# Patient Record
Sex: Male | Born: 1945 | Race: White | Hispanic: No | Marital: Married | Smoking: Former smoker
Health system: Southern US, Community
[De-identification: ages and names within clinical notes are randomized; demographics above are authoritative.]

## PROBLEM LIST (undated history)

## (undated) DIAGNOSIS — K5190 Ulcerative colitis, unspecified, without complications: Secondary | ICD-10-CM

## (undated) DIAGNOSIS — G510 Bell's palsy: Secondary | ICD-10-CM

## (undated) DIAGNOSIS — Z8669 Personal history of other diseases of the nervous system and sense organs: Secondary | ICD-10-CM

## (undated) DIAGNOSIS — R609 Edema, unspecified: Secondary | ICD-10-CM

## (undated) DIAGNOSIS — I219 Acute myocardial infarction, unspecified: Secondary | ICD-10-CM

## (undated) DIAGNOSIS — G4733 Obstructive sleep apnea (adult) (pediatric): Secondary | ICD-10-CM

## (undated) DIAGNOSIS — I2510 Atherosclerotic heart disease of native coronary artery without angina pectoris: Secondary | ICD-10-CM

## (undated) DIAGNOSIS — E785 Hyperlipidemia, unspecified: Secondary | ICD-10-CM

## (undated) DIAGNOSIS — E669 Obesity, unspecified: Secondary | ICD-10-CM

## (undated) DIAGNOSIS — I10 Essential (primary) hypertension: Secondary | ICD-10-CM

## (undated) HISTORY — DX: Atherosclerotic heart disease of native coronary artery without angina pectoris: I25.10

## (undated) HISTORY — DX: Ulcerative colitis, unspecified, without complications: K51.90

## (undated) HISTORY — DX: Obesity, unspecified: E66.9

## (undated) HISTORY — DX: Acute myocardial infarction, unspecified: I21.9

## (undated) HISTORY — DX: Edema, unspecified: R60.9

## (undated) HISTORY — DX: Hyperlipidemia, unspecified: E78.5

## (undated) HISTORY — DX: Essential (primary) hypertension: I10

## (undated) HISTORY — DX: Bell's palsy: G51.0

## (undated) HISTORY — DX: Obstructive sleep apnea (adult) (pediatric): G47.33

---

## 1994-10-28 DIAGNOSIS — I219 Acute myocardial infarction, unspecified: Secondary | ICD-10-CM

## 1994-10-28 HISTORY — DX: Acute myocardial infarction, unspecified: I21.9

## 2005-11-27 ENCOUNTER — Ambulatory Visit: Payer: Self-pay | Admitting: Cardiology

## 2005-12-19 ENCOUNTER — Encounter: Payer: Self-pay | Admitting: Neurology

## 2009-07-05 ENCOUNTER — Ambulatory Visit: Payer: Self-pay | Admitting: Cardiology

## 2010-04-27 HISTORY — PX: OTHER SURGICAL HISTORY: SHX169

## 2014-02-03 ENCOUNTER — Encounter: Payer: Self-pay | Admitting: Neurology

## 2014-02-08 ENCOUNTER — Ambulatory Visit (INDEPENDENT_AMBULATORY_CARE_PROVIDER_SITE_OTHER): Payer: Medicare Other | Admitting: Neurology

## 2014-02-08 ENCOUNTER — Encounter: Payer: Self-pay | Admitting: Neurology

## 2014-02-08 ENCOUNTER — Encounter (INDEPENDENT_AMBULATORY_CARE_PROVIDER_SITE_OTHER): Payer: Self-pay

## 2014-02-08 VITALS — BP 139/76 | HR 57 | Resp 16 | Ht 70.0 in | Wt 221.0 lb

## 2014-02-08 DIAGNOSIS — G4733 Obstructive sleep apnea (adult) (pediatric): Secondary | ICD-10-CM

## 2014-02-08 NOTE — Patient Instructions (Signed)

## 2014-02-08 NOTE — Progress Notes (Signed)
[facility]  Provider:  Melvyn Novas, M D  Referring Provider: No ref. provider found Primary Care Physician:  Maebelle Munroe Hix, MD  at [facility], Family Practice     HPI:  Alejandro Sherman is a 68 y.o. caucasian, right handed, married  male , who is seen here as a revisit ( from Dr. Bo Merino) for CPAP compliance.   [name] underwent a sleep study on 12-20-05, at the time with and excessive daytime sleepiness complaint, reflected and an Epworth score of 16 points. At the time of the study his BMI was 31.7 his neck circumference 18 inches and he had a history of hypertension, elevated cholesterol and nocturia. The study showed an AHI of 15.9 REM sleep was not noted prior to CPAP initiation the patient was then titrated to 8 cm water with an AHI of 0.0. The last time I had seen [name] was in March  2012, 3 years ago. He states he has continued to do well with his CPAP machine and is very compliant.  We were able to download his machine he and his average time of day he use of CPAP at 7 hours and 40 minutes, his compliance is 100% for 90 days and is residual AHI is 3.0. The machine is set between 4 and 20 Cm water  ,  his 95th percentile use is  10.5 cm water . Since the patient has a moderate air leak and seems to benefit from this variable setting,  I would not suggest to reduce the pressure window .  He used to be followed by [facility]. He is now using an internet based supplier. He uses a nasal pillow.   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 He is no longer bound to an early rise time in the morning and has developed some changes in his sleep pattern. He likes to read and that normally would initiate sleep before midnight. He usually sleeps at night through and has nocturia breaks. He falls asleep rather promptly. He still wakes up spontaneously and does not require an alarm. He frequently has a dryish mouth  in the morning . He does not report any morning headaches.  He is usually refreshed and restored as he rises. He takes a regular breakfast at home, he does not use any caffeine at all.  Very rarely would he be napping daytime. His average time of sleep per night is approximately 8 hours.  This is reflected in his time of CPAP use.  He exercises " religiously" , twice a week [place] [employer]. He walks daily a 2 mile distance. He uses a treat mill and elliptical trainer at [employer]. He doesn't regularly swim.     Review of Systems: Out of a complete 14 system review, the patient complains of only the following symptoms, and all other reviewed systems are negative. The patient endorsed date the fatigue score at 24 points in the Epworth sleepiness score at 12 points , GDS  at 0 points.  History   Social History  . Marital Status: Married    Spouse Name: Alejandro Sherman    Number of Children: 2  . Years of Education: 16   Occupational History  . Not on file.   Social History Main Topics  . Smoking status: Former Games developer  . Smokeless tobacco: Never Used  . Alcohol Use: Yes  Comment: 2 glasses of wine- nightly  . Drug Use: No  . Sexual Activity: Not on file   Other Topics Concern  . Not on file   Social History Narrative   Patient is married. Alejandro Sherman(Pamela)   Patient has two adult children.   Patient is retired.   Patient has a college education.   Patient is right-handed.   Patient does not drink any caffeine.          Family History  Problem Relation Age of Onset  . Kidney cancer Mother   . Heart attack Father   . Ovarian cancer Sister     Past Medical History  Diagnosis Date  . Ulcerative colitis   . Edema   . Bell's palsy     20 years ago, affecting the left facial nerve  . CAD (coronary artery disease)   . Myocardial infarction 1996  . Obesity   . Hyperlipidemia   . OSA (obstructive sleep apnea)     on CPAP,  12-19-05  . Essential hypertension,  benign     Past Surgical History  Procedure Laterality Date  . Ulcerative colitis  04/2010    Hospitalization    Current Outpatient Prescriptions  Medication Sig Dispense Refill  . aspirin 81 MG tablet Take 81 mg by mouth daily.      Marland Kitchen. atorvastatin (LIPITOR) 40 MG tablet Take 40 mg by mouth daily.      . Calcium Carbonate-Vitamin D 600-400 MG-UNIT per tablet Take 1 tablet by mouth 2 (two) times daily.      Marland Kitchen. ezetimibe (ZETIA) 10 MG tablet Take 10 mg by mouth daily.      . fluticasone (FLONASE) 50 MCG/ACT nasal spray Place 2 sprays into both nostrils daily.      . mesalamine (LIALDA) 1.2 G EC tablet Take 1.2 g by mouth daily with breakfast.      . metoprolol succinate (TOPROL-XL) 50 MG 24 hr tablet Take 50 mg by mouth daily. Take with or immediately following a meal.      . ranitidine (ZANTAC) 150 MG capsule Take 150 mg by mouth 2 (two) times daily.       No current facility-administered medications for this visit.    Allergies as of 02/08/2014  . (Not on File)    Vitals: BP 139/76  Pulse 57  Resp 16  Ht 5\' 10"  (1.778 m)  Wt 221 lb (100.245 kg)  BMI 31.71 kg/m2 Last Weight:  Wt Readings from Last 1 Encounters:  02/08/14 221 lb (100.245 kg)   Last Height:   Ht Readings from Last 1 Encounters:  02/08/14 5\' 10"  (1.778 m)     Physical exam:  General: The patient is awake, alert and appears not in acute distress. The patient is well groomed. Head: Normocephalic, atraumatic. Neck is supple. Mallampati 3 , neck circumference: 19 inches . Cardiovascular:  Regular rate and rhythm , without  murmurs or carotid bruit, and without distended neck veins. Respiratory: Lungs are clear to auscultation. Skin:  Without evidence of edema, or rash Trunk: BMI is elevated and patient  has normal posture.  Neurologic exam : The patient is awake and alert, oriented to place and time.  Memory subjective described as intact.  There is a normal attention span & concentration ability. Speech  is fluent without  dysarthria, dysphonia or aphasia. Mood and affect are appropriate.  Cranial nerves: Pupils are equal and briskly reactive to light. Funduscopic exam without  evidence of pallor or edema.  Extraocular  movements  in vertical and horizontal planes intact and without nystagmus. Visual fields by finger perimetry are intact. Hearing to finger rub intact.  Facial sensation intact to fine touch. Facial motor strength is symmetric and tongue and uvula move midline.  Motor exam: Normal tone and , muscle bulk and symmetric normal strength in all extremities.  Sensory:  Fine touch, pinprick and vibration were tested in all extremities. Proprioception is  normal.  Coordination: Rapid alternating movements in the fingers/hands is normal without evidence of ataxia, dysmetria or tremor.  Gait and station: Patient walks without assistive device. Strength within normal limits. Stance is stable and normal.   Deep tendon reflexes: in the  upper and lower extremities are symmetric and intact.    Assessment:  After physical and neurologic examination, review of laboratory studies, imaging, neurophysiology testing and pre-existing records, assessment is   1) OSA , well controlled on CPAP , auto set 4-20 cm water. We could reduce the window to 4-14 cm as his 95% is near 10.  He requires a prescription of new mask, tube,   Plan:  Treatment plan and additional workup : provide replacement parts.  Patient well aware of BMI related risk for OSA, feels comfortable wit his pressure.

## 2014-08-12 ENCOUNTER — Other Ambulatory Visit: Payer: Self-pay

## 2014-09-13 ENCOUNTER — Encounter: Payer: Self-pay | Admitting: Neurology

## 2015-01-05 ENCOUNTER — Ambulatory Visit (INDEPENDENT_AMBULATORY_CARE_PROVIDER_SITE_OTHER): Payer: Medicare Other | Admitting: Adult Health

## 2015-01-05 ENCOUNTER — Encounter: Payer: Self-pay | Admitting: Adult Health

## 2015-01-05 VITALS — BP 153/81 | HR 65 | Ht 70.0 in | Wt 218.0 lb

## 2015-01-05 DIAGNOSIS — Z9989 Dependence on other enabling machines and devices: Principal | ICD-10-CM

## 2015-01-05 DIAGNOSIS — G4733 Obstructive sleep apnea (adult) (pediatric): Secondary | ICD-10-CM

## 2015-01-05 NOTE — Patient Instructions (Signed)
Continue using CPAP daily. I will call you if results show any concerns.

## 2015-01-05 NOTE — Progress Notes (Signed)
PATIENT: Alejandro Sherman DOB: 05-03-1946  REASON FOR VISIT: follow up- OSA on CPAP HISTORY FROM: patient  HISTORY OF PRESENT ILLNESS: Alejandro Sherman is a 69 year old male with history of obstructive sleep Apnea. He returns today for a 90 day compliance download. The patient did not bring his card with him today. We called his DME company to get a download. However there has been a malfunction with his card. The DME company is asked that he bring her card by their office for review. The patient states that he has been using his CPAP nightly. His Epworth score is 9 points was previously 12 points. His fatigue severity score is 18 was previously 24.  Patient reports that he gets about 8 hours of sleep a night. He goes to bed around 11:30 PM and arises at 7:30 AM. He denies having trouble falling asleep or staying a sleep. States that he rarely has to get up at  night to urinate. Patient states that he has been exercising 3 times a week. On his off days him and his wife will go for a walk. The patient also likes to dance. He and his wife frequently go to shagging events. Overall patient feels that CPAP has improved his sleepiness and fatigue. Since the last visit the patient has had no new medical issues.   HISTORY 02/08/14 [facility]): Alejandro Sherman is a 69 y.o. caucasian, right handed, married male , who is seen here as a revisit ( from Dr. Bo Merino) for CPAP compliance.   Alejandro Sherman underwent a sleep study on 12-20-05, at the time with and excessive daytime sleepiness complaint, reflected and an Epworth score of 16 points. At the time of the study his BMI was 31.7 his neck circumference 18 inches and he had a history of hypertension, elevated cholesterol and nocturia. The study showed an AHI of 15.9 REM sleep was not noted prior to CPAP initiation the patient was then titrated to 8 cm water with an AHI of 0.0. The last time I had seen Alejandro Sherman was in March 2012, 3 years ago. He states he has continued to  do well with his CPAP machine and is very compliant.  We were able to download his machine he and his average time of day he use of CPAP at 7 hours and 40 minutes, his compliance is 100% for 90 days and is residual AHI is 3.0. The machine is set between 4 and 20 Cm water , his 95th percentile use is 10.5 cm water . Since the patient has a moderate air leak and seems to benefit from this variable setting, I would not suggest to reduce the pressure window . He used to be followed by [facility]. He is now using an internet based supplier. He uses a nasal pillow.   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 He is no longer bound to an early rise time in the morning and has developed some changes in his sleep pattern. He likes to read and that normally would initiate sleep before midnight. He usually sleeps at night through and has nocturia breaks. He falls asleep rather promptly. He still wakes up spontaneously and does not require an alarm. He frequently has a dryish mouth in the morning . He does not report any morning headaches.  He is usually refreshed and restored as he rises. He takes a regular breakfast at home, he does  not use any caffeine at all.  Very rarely would he be napping daytime. His average time of sleep per night is approximately 8 hours.  This is reflected in his time of CPAP use.  He exercises " religiously" , twice a week to silver sneakers [place] [employer]. He walks daily a 2 mile distance. He uses a treat mill and elliptical trainer at [employer]. He doesn't regularly swim.  REVIEW OF SYSTEMS: Out of a complete 14 system review of symptoms, the patient complains only of the following symptoms, and all other reviewed systems are negative. See history of present illness  ALLERGIES: Not on File  HOME MEDICATIONS: Outpatient Prescriptions Prior to Visit  Medication Sig Dispense Refill  . aspirin 81 MG  tablet Take 81 mg by mouth daily.    Marland Kitchen. atorvastatin (LIPITOR) 40 MG tablet Take 40 mg by mouth daily.    . Calcium Carbonate-Vitamin D 600-400 MG-UNIT per tablet Take 1 tablet by mouth 2 (two) times daily.    Marland Kitchen. ezetimibe (ZETIA) 10 MG tablet Take 10 mg by mouth daily.    . fluticasone (FLONASE) 50 MCG/ACT nasal spray Place 2 sprays into both nostrils daily.    . mesalamine (LIALDA) 1.2 G EC tablet Take 1.2 g by mouth daily with breakfast.    . metoprolol succinate (TOPROL-XL) 50 MG 24 hr tablet Take 50 mg by mouth daily. Take with or immediately following a meal.    . ranitidine (ZANTAC) 150 MG capsule Take 150 mg by mouth 2 (two) times daily.     No facility-administered medications prior to visit.    PAST MEDICAL HISTORY: Past Medical History  Diagnosis Date  . Ulcerative colitis   . Edema   . Bell's palsy     20 years ago, affecting the left facial nerve  . CAD (coronary artery disease)   . Myocardial infarction 1996  . Obesity   . Hyperlipidemia   . OSA (obstructive sleep apnea)     on CPAP,  12-19-05  . Essential hypertension, benign     PAST SURGICAL HISTORY: Past Surgical History  Procedure Laterality Date  . Ulcerative colitis  04/2010    Hospitalization    FAMILY HISTORY: Family History  Problem Relation Age of Onset  . Kidney cancer Mother   . Heart attack Father   . Ovarian cancer Sister     SOCIAL HISTORY: History   Social History  . Marital Status: Married    Spouse Name: Rinaldo Cloudamela  . Number of Children: 2  . Years of Education: 16   Occupational History  . Not on file.   Social History Main Topics  . Smoking status: Former Games developermoker  . Smokeless tobacco: Never Used  . Alcohol Use: Yes     Comment: 2 glasses of wine- nightly  . Drug Use: No  . Sexual Activity: Not on file   Other Topics Concern  . Not on file   Social History Narrative   Patient is married. [name](Pamela)   Patient has two adult children.   Patient is retired.   Patient has a  college education.   Patient is right-handed.   Patient does not drink any caffeine.            PHYSICAL EXAM  Filed Vitals:   01/05/15 1057  BP: 153/81  Pulse: 65  Height: 5\' 10"  (1.778 m)  Weight: 218 lb (98.884 kg)   Body mass index is 31.28 kg/(m^2).  Generalized: Well developed, in no acute distress  Neck: Circumference 18 inches Mallampati 4+  Neurological examination  Mentation: Alert oriented to time, place, history taking. Follows all commands speech and language fluent Cranial nerve II-XII: Pupils were equal round reactive to light. Extraocular movements were full, visual field were full on confrontational test. Facial sensation and strength were normal. Uvula tongue midline. Head turning and shoulder shrug  were normal and symmetric. Motor: The motor testing reveals 5 over 5 strength of all 4 extremities. Good symmetric motor tone is noted throughout.  Sensory: Sensory testing is intact to soft touch on all 4 extremities. No evidence of extinction is noted.  Coordination: Cerebellar testing reveals good finger-nose-finger and heel-to-shin bilaterally.  Gait and station: Gait is normal.  Reflexes: Deep tendon reflexes are symmetric and normal bilaterally.     DIAGNOSTIC DATA (LABS, IMAGING, TESTING) - I reviewed patient records, labs, notes, testing and imaging myself where available.     ASSESSMENT AND PLAN 69 y.o. year old male  has a past medical history of Ulcerative colitis; Edema; Bell's palsy; CAD (coronary artery disease); Myocardial infarction (1996); Obesity; Hyperlipidemia; OSA (obstructive sleep apnea); and Essential hypertension, benign. here with:  1. OSA on CPAP  The patient did not bring his card with him today. According to his DME company there has been a malfunction with the card. They were unable to send [place] a download. The patient was instructed to take his card to his DME company for review. Once his card is working properly the DME company  can send [place] a compliance report or the patient can bring his card to the sleep lab for a download. Patient verbalized understanding. The patient was also provided with a prescription for renewal of his CPAP supplies. He will follow-up in one year or sooner if needed.    Butch Penny, MSN, NP-C 01/05/2015, 10:56 AM [facility] [address] [place], [place] [address] [phone]  Note: This document was prepared with digital dictation and possible smart phrase technology. Any transcriptional errors that result from this process are unintentional.

## 2015-01-05 NOTE — Progress Notes (Signed)
I agree with the assessment and plan as directed by NP .The patient is known to me .   Gregori Abril, MD  

## 2015-01-31 ENCOUNTER — Telehealth: Payer: Self-pay | Admitting: Neurology

## 2015-01-31 DIAGNOSIS — G4733 Obstructive sleep apnea (adult) (pediatric): Secondary | ICD-10-CM

## 2015-01-31 DIAGNOSIS — Z9989 Dependence on other enabling machines and devices: Principal | ICD-10-CM

## 2015-01-31 NOTE — Telephone Encounter (Signed)
Patient contacted the office requesting a prescription for a new CPAP machine and his sleep study be faxed to the [facility] at [phone]. Patient can be reached at [phone].

## 2015-02-01 NOTE — Telephone Encounter (Signed)
cpap order placed. pls fax sleep studies to [facility]

## 2015-02-10 ENCOUNTER — Ambulatory Visit: Payer: Medicare Other | Admitting: Neurology

## 2015-02-10 ENCOUNTER — Ambulatory Visit: Payer: Medicare Other | Admitting: Nurse Practitioner

## 2015-02-20 ENCOUNTER — Encounter: Payer: Self-pay | Admitting: Neurology

## 2015-04-24 ENCOUNTER — Other Ambulatory Visit: Payer: Self-pay

## 2016-01-02 ENCOUNTER — Encounter: Payer: Self-pay | Admitting: Adult Health

## 2016-01-02 ENCOUNTER — Ambulatory Visit (INDEPENDENT_AMBULATORY_CARE_PROVIDER_SITE_OTHER): Payer: Medicare Other | Admitting: Adult Health

## 2016-01-02 VITALS — BP 157/81 | HR 59 | Ht 70.0 in | Wt 217.0 lb

## 2016-01-02 DIAGNOSIS — G4733 Obstructive sleep apnea (adult) (pediatric): Secondary | ICD-10-CM

## 2016-01-02 DIAGNOSIS — R413 Other amnesia: Secondary | ICD-10-CM

## 2016-01-02 DIAGNOSIS — Z9989 Dependence on other enabling machines and devices: Principal | ICD-10-CM

## 2016-01-02 NOTE — Progress Notes (Signed)
Patient brought his memory card back to the office for a download. His download indicates that he uses machine for compliance of 100%. He uses machine greater than 4 hours each night. On average he uses his machine 7 hours and 21 minutes. His residual AHI is 0.9 on a minimum pressure of 5 cm of water and maximum pressure of 20 cm of water. The patient has a leak in the 95th percentile at 20.8 L/m.

## 2016-01-02 NOTE — Progress Notes (Signed)
PATIENT: Alejandro Sherman DOB: September 17, 1946  REASON FOR VISIT: follow up- obstructive sleep apnea on CPAP HISTORY FROM: patient  HISTORY OF PRESENT ILLNESS: Mr. Alejandro Sherman is a 70 year old male with a history of obstructive sleep apnea. He returns today for a compliance download. The patient recently got a new CPAP machine through the [facility]. He was unsure if he should bring the machine as he was under the assumption that we can do a wireless download. He states that he will bring his memory card by here this afternoon. The patient's wife is with him today. She has some concerns about his cognition. She states that he has a hard time with his short-term memory. She states that he will forget conversations that took place an hour ago. The patient has also noticed that he has a hard time recalling certain things. They do not feel that the change has been overly significant just something they have taken notice of. He is able to complete all ADLs independently. He operates a Librarian, academic without difficulty. Denies any depression or anxiety. He returns today for an evaluation.  HISTORY 01/05/15:Mr. Alejandro Sherman is a 70 year old male with history of obstructive sleep Apnea. He returns today for a 90 day compliance download. The patient did not bring his card with him today. We called his DME company to get a download. However there has been a malfunction with his card. The DME company is asked that he bring her card by their office for review. The patient states that he has been using his CPAP nightly. His Epworth score is 9 points was previously 12 points. His fatigue severity score is 18 was previously 24. Patient reports that he gets about 8 hours of sleep a night. He goes to bed around 11:30 PM and arises at 7:30 AM. He denies having trouble falling asleep or staying a sleep. States that he rarely has to get up at night to urinate. Patient states that he has been exercising 3 times a week. On his off days him  and his wife will go for a walk. The patient also likes to dance. He and his wife frequently go to shagging events. Overall patient feels that CPAP has improved his sleepiness and fatigue. Since the last visit the patient has had no new medical issues.   HISTORY 02/08/14 [facility] Bruk Tumolo is a 70 y.o. caucasian, right handed, married male , who is seen here as a revisit ( from Dr. Bo Merino) for CPAP compliance.   Mr. Alejandro Sherman underwent a sleep study on 12-20-05, at the time with and excessive daytime sleepiness complaint, reflected and an Epworth score of 16 points. At the time of the study his BMI was 31.7 his neck circumference 18 inches and he had a history of hypertension, elevated cholesterol and nocturia. The study showed an AHI of 15.9 REM sleep was not noted prior to CPAP initiation the patient was then titrated to 8 cm water with an AHI of 0.0. The last time I had seen Mr. Alejandro Sherman was in March 2012, 3 years ago. He states he has continued to do well with his CPAP machine and is very compliant.  We were able to download his machine he and his average time of day he use of CPAP at 7 hours and 40 minutes, his compliance is 100% for 90 days and is residual AHI is 3.0. The machine is set between 4 and 20 Cm water , his 95th percentile use is 10.5 cm  water . Since the patient has a moderate air leak and seems to benefit from this variable setting, I would not suggest to reduce the pressure window . He used to be followed by [facility]. He is now using an internet based supplier. He uses a nasal pillow.   [name] meanwhile retired ( he was a Baristadaytime office worker) and earlier this year his wife retired as well. He is no longer bound to an early rise time in the morning and has developed some changes in his sleep pattern. He likes to read and that normally would initiate sleep before midnight. He usually sleeps at night through and has nocturia breaks. He falls asleep rather promptly. He still  wakes up spontaneously and does not require an alarm. He frequently has a dryish mouth in the morning . He does not report any morning headaches.  He is usually refreshed and restored as he rises. He takes a regular breakfast at home, he does not use any caffeine at all.  Very rarely would he be napping daytime. His average time of sleep per night is approximately 8 hours.  This is reflected in his time of CPAP use.  He exercises " religiously" , twice a week [place]akers [place] [employer]. He walks daily a 2 mile distance. He uses a treat mill and elliptical trainer at [employer]. He doesn't regularly swim.  REVIEW OF SYSTEMS: Out of a complete 14 system review of symptoms, the patient complains only of the following symptoms, and all other reviewed systems are negative.  ALLERGIES: Allergies  Allergen Reactions  . [place] Allergy Skin Test Other (See Comments)    Sneezing, itchy watery eyes    HOME MEDICATIONS: Outpatient Prescriptions Prior to Visit  Medication Sig Dispense Refill  . aspirin 81 MG tablet Take 81 mg by mouth daily.    Marland Kitchen. atorvastatin (LIPITOR) 40 MG tablet Take 40 mg by mouth daily.    . Calcium Carbonate-Vitamin D 600-400 MG-UNIT per tablet Take 1 tablet by mouth 2 (two) times daily.    Marland Kitchen. ezetimibe (ZETIA) 10 MG tablet Take 10 mg by mouth daily.    . fluticasone (FLONASE) 50 MCG/ACT nasal spray Place 2 sprays into both nostrils daily.    . mesalamine (LIALDA) 1.2 G EC tablet Take 1.2 g by mouth daily with breakfast.    . metoprolol succinate (TOPROL-XL) 50 MG 24 hr tablet Take 50 mg by mouth daily. Take with or immediately following a meal.     No facility-administered medications prior to visit.    PAST MEDICAL HISTORY: Past Medical History  Diagnosis Date  . Ulcerative colitis (HCC)   . Edema   . Bell's palsy     20 years ago, affecting the left facial nerve  . CAD (coronary artery disease)   . Myocardial infarction (HCC) 1996  . Obesity   .  Hyperlipidemia   . OSA (obstructive sleep apnea)     on CPAP,  12-19-05  . Essential hypertension, benign     PAST SURGICAL HISTORY: Past Surgical History  Procedure Laterality Date  . Ulcerative colitis  04/2010    Hospitalization    FAMILY HISTORY: Family History  Problem Relation Age of Onset  . Kidney cancer Mother   . Heart attack Father   . Ovarian cancer Sister     SOCIAL HISTORY: Social History   Social History  . Marital Status: Married    Spouse Name: Rinaldo Cloudamela  . Number of Children: 2  .  Years of Education: 16   Occupational History  . Not on file.   Social History Main Topics  . Smoking status: Former Games developer  . Smokeless tobacco: Never Used  . Alcohol Use: 0.0 oz/week    0 Standard drinks or equivalent per week     Comment: 2 glasses of wine- nightly  . Drug Use: No  . Sexual Activity: Not on file   Other Topics Concern  . Not on file   Social History Narrative   Patient is married. [name])   Patient has two adult children.   Patient is retired.   Patient has a college education.   Patient is right-handed.   Patient does not drink any caffeine.            PHYSICAL EXAM  Filed Vitals:   01/02/16 0947  BP: 157/81  Pulse: 59  Height:  (1.778 m)  Weight: 217 lb (98.431 kg)   Body mass index is 31.14 kg/(m^2).  Generalized: Well developed, in no acute distress   Neurological examination  Mentation: Alert oriented to time, place, history taking. Follows all commands speech and language fluent Cranial nerve II-XII: Pupils were equal round reactive to light. Extraocular movements were full, visual field were full on confrontational test. Facial sensation and strength were normal. Uvula tongue midline. Head turning and shoulder shrug  were normal and symmetric. Motor: The motor testing reveals 5 over 5 strength of all 4 extremities. Good symmetric motor tone is noted throughout.  Sensory: Sensory testing is intact to soft touch on all 4  extremities. No evidence of extinction is noted.  Coordination: Cerebellar testing reveals good finger-nose-finger and heel-to-shin bilaterally.  Gait and station: Gait is normal. Tandem gait is normal. Romberg is negative. No drift is seen.  Reflexes: Deep tendon reflexes are symmetric and normal bilaterally.   DIAGNOSTIC DATA (LABS, IMAGING, TESTING) - I reviewed patient records, labs, notes, testing and imaging myself where available.     ASSESSMENT AND PLAN 70 y.o. year old male  has a past medical history of Ulcerative colitis (HCC); Edema; Bell's palsy; CAD (coronary artery disease); Myocardial infarction (HCC) (1996); Obesity; Hyperlipidemia; OSA (obstructive sleep apnea); and Essential hypertension, benign. here with:  1. Obstructive sleep apnea on CPAP 2. Memory disturbance  The patient will bring his memory card office this afternoon for a download. The patient's memory score today is MMSE 28/30. We will continue to monitor his memory. Patient advised that if his symptoms worsen or he develops any new symptoms he should let us know. He will follow-up in 3 months or sooner if needed.     Butch Penny, MSN, NP-C 01/02/2016, 10:40 AM [facility] [address] [place], [place] [address] [phone]

## 2016-01-02 NOTE — Patient Instructions (Signed)
Please bring memory card by the sleep lab for download We will continue to monitor memory If your symptoms worsen or you develop new symptoms please let us know.

## 2016-01-02 NOTE — Progress Notes (Signed)
I reviewed above note and agree with the assessment and plan.  Marvel PlanJindong Adrienna Karis, MD PhD Stroke Neurology 01/02/2016 5:14 PM

## 2016-04-03 ENCOUNTER — Ambulatory Visit: Payer: Medicare Other | Admitting: Adult Health

## 2016-05-06 ENCOUNTER — Encounter: Payer: Self-pay | Admitting: Adult Health

## 2016-05-06 ENCOUNTER — Ambulatory Visit (INDEPENDENT_AMBULATORY_CARE_PROVIDER_SITE_OTHER): Payer: Medicare Other | Admitting: Adult Health

## 2016-05-06 VITALS — BP 118/69 | HR 62 | Ht 70.0 in | Wt 216.4 lb

## 2016-05-06 DIAGNOSIS — Z9989 Dependence on other enabling machines and devices: Principal | ICD-10-CM

## 2016-05-06 DIAGNOSIS — R413 Other amnesia: Secondary | ICD-10-CM

## 2016-05-06 DIAGNOSIS — G4733 Obstructive sleep apnea (adult) (pediatric): Secondary | ICD-10-CM | POA: Diagnosis not present

## 2016-05-06 NOTE — Patient Instructions (Signed)
Continue CPAP nightly Memory score stable If your symptoms worsen or you develop new symptoms please let us know.

## 2016-05-06 NOTE — Progress Notes (Signed)
I agree with the assessment and plan as directed by NP .The patient is known to me .   Pradeep Beaubrun, MD  

## 2016-05-06 NOTE — Progress Notes (Signed)
PATIENT: Alejandro Sherman DOB: [birth year]  REASON FOR VISIT: follow up- obstructive sleep apnea, mild memory disturbance HISTORY FROM: patient  HISTORY OF PRESENT ILLNESS: Alejandro Sherman is a 70 year old male with a history of obstructive sleep apnea on CPAP and mild memory disturbance. He returns today for a compliance download. His download indicates that he uses machine 30 out of 30 days for compliance of 100%. Each night he uses machine greater than 4 hours. On average he uses his machine 7 hours and 36 minutes. His residual AHI is 1.0 on a minimum pressure of 5 cm water and maximum pressure 20 cm water with EPR of 1. The patient's leak in the 95th percentile is 21.8. He uses the nasal pillows. Patient states occasionally the straps will wear out. He just recently replaced his supplies. The patient feels that his memory has remained stable. His wife reports that she continues to have to repeat herself continuously. Unsure if this is intentional. Patient is able to complete all ADLs independently. He operates a Librarian, academic independently. Sleeping well at night. Good appetite. Returns today for an evaluation.   HISTORY 01/05/15 (MM): Alejandro Sherman is a 70 year old male with history of obstructive sleep Apnea. He returns today for a 90 day compliance download. The patient did not bring his card with him today. We called his DME company to get a download. However there has been a malfunction with his card. The DME company is asked that he bring her card by their office for review. The patient states that he has been using his CPAP nightly. His Epworth score is 9 points was previously 12 points. His fatigue severity score is 18 was previously 24. Patient reports that he gets about 8 hours of sleep a night. He goes to bed around 11:30 PM and arises at 7:30 AM. He denies having trouble falling asleep or staying a sleep. States that he rarely has to get up at night to urinate. Patient states that he has been  exercising 3 times a week. On his off days him and his wife will go for a walk. The patient also likes to dance. He and his wife frequently go to shagging events. Overall patient feels that CPAP has improved his sleepiness and fatigue. Since the last visit the patient has had no new medical issues.   HISTORY 02/08/14 [facility]): Alejandro Sherman is a 70 y.o. caucasian, right handed, married male , who is seen here as a revisit ( from Dr. Bo Merino) for CPAP compliance.   [name] underwent a sleep study on 12-20-05, at the time with and excessive daytime sleepiness complaint, reflected and an Epworth score of 16 points. At the time of the study his BMI was 31.7 his neck circumference 18 inches and he had a history of hypertension, elevated cholesterol and nocturia. The study showed an AHI of 15.9 REM sleep was not noted prior to CPAP initiation the patient was then titrated to 8 cm water with an AHI of 0.0. The last time I had seen Alejandro Sherman was in March 2012, 3 years ago. He states he has continued to do well with his CPAP machine and is very compliant.  We were able to download his machine he and his average time of day he use of CPAP at 7 hours and 40 minutes, his compliance is 100% for 90 days and is residual AHI is 3.0. The machine is set between 4 and 20 Cm water , his 95th percentile  use is 10.5 cm water . Since the patient has a moderate air leak and seems to benefit from this variable setting, I would not suggest to reduce the pressure window . He used to be followed by [facility]. He is now using an internet based supplier. He uses a nasal pillow.   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 He is no longer bound to an early rise time in the morning and has developed some changes in his sleep pattern. He likes to read and that normally would initiate sleep before midnight. He usually sleeps at night through and has nocturia  breaks. He falls asleep rather promptly. He still wakes up spontaneously and does not require an alarm. He frequently has a dryish mouth in the morning . He does not report any morning headaches.  He is usually refreshed and restored as he rises. He takes a regular breakfast at home, he does not use any caffeine at all.  Very rarely would he be napping daytime. His average time of sleep per night is approximately 8 hours.  This is reflected in his time of CPAP use.  He exercises " religiously" , twice a week to silver sneakers [place] [employer]. He walks daily a 2 mile distance. He uses a treat mill and elliptical trainer at [employer]. He doesn't regularly swim.  REVIEW OF SYSTEMS: Out of a complete 14 system review of symptoms, the patient complains only of the following symptoms, and all other reviewed systems are negative.  See history of present illness  ALLERGIES: Allergies  Allergen Reactions  . [place] Grass Allergy Skin Test Other (See Comments)    Sneezing, itchy watery eyes    HOME MEDICATIONS: Outpatient Prescriptions Prior to Visit  Medication Sig Dispense Refill  . aspirin 81 MG tablet Take 81 mg by mouth daily.    Marland Kitchen atorvastatin (LIPITOR) 40 MG tablet Take 40 mg by mouth daily.    . Calcium Carbonate-Vitamin D 600-400 MG-UNIT per tablet Take 1 tablet by mouth 2 (two) times daily.    Marland Kitchen ezetimibe (ZETIA) 10 MG tablet Take 10 mg by mouth daily.    . fluticasone (FLONASE) 50 MCG/ACT nasal spray Place 2 sprays into both nostrils daily.    . hydrochlorothiazide (HYDRODIURIL) 25 MG tablet Take 25 mg by mouth daily.    . mesalamine (LIALDA) 1.2 G EC tablet Take 1.2 g by mouth daily with breakfast.    . metoprolol succinate (TOPROL-XL) 50 MG 24 hr tablet Take 50 mg by mouth daily. Take with or immediately following a meal.    . potassium chloride (K-DUR,KLOR-CON) 10 MEQ tablet Take 2 tablets by mouth daily.     No facility-administered medications prior to visit.    PAST  MEDICAL HISTORY: Past Medical History  Diagnosis Date  . Ulcerative colitis (HCC)   . Edema   . Bell's palsy     20 years ago, affecting the left facial nerve  . CAD (coronary artery disease)   . Myocardial infarction (HCC) 1996  . Obesity   . Hyperlipidemia   . OSA (obstructive sleep apnea)     on CPAP,  12-19-05  . Essential hypertension, benign     PAST SURGICAL HISTORY: Past Surgical History  Procedure Laterality Date  . Ulcerative colitis  04/2010    Hospitalization    FAMILY HISTORY: Family History  Problem Relation Age of Onset  . Kidney cancer Mother   .  Heart attack Father   . Ovarian cancer Sister     SOCIAL HISTORY: Social History   Social History  . Marital Status: Married    Spouse Name: Alejandro Sherman  . Number of Children: 2  . Years of Education: 16   Occupational History  . Not on file.   Social History Main Topics  . Smoking status: Former Games developermoker  . Smokeless tobacco: Never Used  . Alcohol Use: 0.0 oz/week    0 Standard drinks or equivalent per week     Comment: 2 glasses of wine- nightly  . Drug Use: No  . Sexual Activity: Not on file   Other Topics Concern  . Not on file   Social History Narrative   Patient is married. Alejandro Cloud(Pamela)   Patient has two adult children.   Patient is retired.   Patient has a college education.   Patient is right-handed.   Patient does not drink any caffeine.            PHYSICAL EXAM  Filed Vitals:   05/06/16 1136  BP: 118/69  Pulse: 62  Height: 5\' 10"  (1.778 m)  Weight: 216 lb 6.4 oz (98.158 kg)   Body mass index is 31.05 kg/(m^2).   MMSE - Mini Mental State Exam 05/06/2016  Orientation to time 5  Orientation to Place 5  Registration 3  Attention/ Calculation 5  Recall 3  Language- name 2 objects 2  Language- repeat 1  Language- follow 3 step command 3  Language- read & follow direction 1  Write a sentence 1  Copy design 0  Total score 29     Generalized: Well developed, in no acute  distress   Neurological examination  Mentation: Alert oriented to time, place, history taking. Follows all commands speech and language fluent Cranial nerve II-XII: Pupils were equal round reactive to light. Extraocular movements were full, visual field were full on confrontational test. Facial sensation and strength were normal. Uvula tongue midline. Head turning and shoulder shrug  were normal and symmetric. Motor: The motor testing reveals 5 over 5 strength of all 4 extremities. Good symmetric motor tone is noted throughout.  Sensory: Sensory testing is intact to soft touch on all 4 extremities. No evidence of extinction is noted.  Coordination: Cerebellar testing reveals good finger-nose-finger and heel-to-shin bilaterally.  Gait and station: Gait is normal. Tandem gait is normal. Romberg is negative. No drift is seen.  Reflexes: Deep tendon reflexes are symmetric and normal bilaterally.   DIAGNOSTIC DATA (LABS, IMAGING, TESTING) - I reviewed patient records, labs, notes, testing and imaging myself where available.    ASSESSMENT AND PLAN 70 y.o. year old male  has a past medical history of Ulcerative colitis (HCC); Edema; Bell's palsy; CAD (coronary artery disease); Myocardial infarction (HCC) (1996); Obesity; Hyperlipidemia; OSA (obstructive sleep apnea); and Essential hypertension, benign. here with:  1. Obstructive sleep on CPAP 2. Mild memory disturbance  The patient CPAP download is excellent. He should continue using his CPAP nightly. The patient's memory score has also remained stable. We will continue to monitor. He will follow-up in 6 months with Dr. Vergia Alconohmeier    Zaleah Ternes, MSN, NP-C 05/06/2016, 11:35 AM [facility] [address], [address] [place], [place] [address] [phone]

## 2016-11-06 ENCOUNTER — Ambulatory Visit: Payer: Medicare Other | Admitting: Neurology

## 2016-12-11 ENCOUNTER — Ambulatory Visit (INDEPENDENT_AMBULATORY_CARE_PROVIDER_SITE_OTHER): Payer: Medicare Other | Admitting: Neurology

## 2016-12-11 ENCOUNTER — Encounter: Payer: Self-pay | Admitting: Neurology

## 2016-12-11 VITALS — BP 120/68 | HR 76 | Resp 16 | Ht 70.0 in | Wt 205.0 lb

## 2016-12-11 DIAGNOSIS — Z9989 Dependence on other enabling machines and devices: Secondary | ICD-10-CM

## 2016-12-11 DIAGNOSIS — G3184 Mild cognitive impairment, so stated: Secondary | ICD-10-CM

## 2016-12-11 DIAGNOSIS — G4733 Obstructive sleep apnea (adult) (pediatric): Secondary | ICD-10-CM

## 2016-12-11 NOTE — Progress Notes (Signed)
PATIENT: Alejandro Sherman DOB: 1946/07/04  REASON FOR VISIT: follow up- obstructive sleep apnea, mild memory disturbance HISTORY FROM: patient  HISTORY OF PRESENT ILLNESS:   HISTORY 02/08/14 [facility]): Alejandro Sherman is a 71 y.o. caucasian, right handed, married male , who is seen here as a revisit ( from Dr. Bo Merino) for CPAP compliance.  Alejandro Sherman underwent a sleep study on 12-20-05, at the time with and excessive daytime sleepiness complaint, reflected and an Epworth score of 16 points. At the time of the study his BMI was 31.7 his neck circumference 18 inches and he had a history of hypertension, elevated cholesterol and nocturia. The study showed an AHI of 15.9 REM sleep was not noted prior to CPAP initiation the patient was then titrated to 8 cm water with an AHI of 0.0. The last time I had seen Alejandro Sherman was in March 2012, 3 years ago. He states he has continued to do well with his CPAP machine and is very compliant.  We were able to download his machine he and his average time of day he use of CPAP at 7 hours and 40 minutes, his compliance is 100% for 90 days and is residual AHI is 3.0. The machine is set between 4 and 20 cm water, his 95th percentile use is 10.5 cm water . Since the patient has a moderate air leak and seems to benefit from this variable setting, I would not suggest to reduce the pressure window . He used to be followed by [facility]. He is now using an internet based supplier. He uses a nasal pillow.   Social History :  meanwhile retired Psychologist, occupational ( he was a Barista) and earlier this year his wife retired as well. He is no longer bound to an early rise time in the morning and has developed some changes in his sleep pattern.He likes to read and that normally would initiate sleep before midnight. He usually sleeps at night through and has nocturia breaks. He falls asleep rather promptly. He still wakes up spontaneously and does not require an alarm.  He frequently has a dryish mouth in the morning . He does not report any morning headaches.  He is usually refreshed and restored as he rises. He takes a regular breakfast at home, he does not use any caffeine at all.  Very rarely would he be napping daytime. His average time of sleep per night is approximately 8 hours. This is reflected in his time of CPAP use.  He exercises " religiously" , twice a week to silver sneakers [place] [employer]. He walks daily a 2 mile distance. He uses a treat mill and elliptical trainer at [employer]. He doesn't regularly swim.  12-11-2016,  I have the pleasure of seeing [name] today, here for routine compliance visit with CPAP therapy. The patient has used her machine 30 out of 30 days with an average of 7 hours and 38 minutes, this is an AutoSet between 5 and 20 cm water pressure with a full-time EPR of 1 cm. The 95th percentile pressure was 10.5 cm water the residual AHI is excellent at 1.5 per hour of sleep there is no evidence of the therapy induced resurgence of central apneas. There is no need to change any of the settings. His ResMed CPAP was provided through the [place] system. He is interested in a travel CPAP.  In the meantime he underwent a triple ByPass surgery and is in cardiac  rehab.     REVIEW OF SYSTEMS: Out of a complete 14 system review of symptoms, the patient complains only of the following symptoms, and all other reviewed systems are negative.  See history of present illness  ALLERGIES: Allergies  Allergen Reactions  . [place] Grass Allergy Skin Test Other (See Comments)    Sneezing, itchy watery eyes    HOME MEDICATIONS: Outpatient Medications Prior to Visit  Medication Sig Dispense Refill  . aspirin 81 MG tablet Take 81 mg by mouth daily.    Marland Kitchen atorvastatin (LIPITOR) 40 MG tablet Take 40 mg by mouth daily.    Marland Kitchen ezetimibe (ZETIA) 10 MG tablet Take 10 mg by mouth daily.    . fluticasone (FLONASE) 50 MCG/ACT nasal spray Place 2  sprays into both nostrils daily.    . mesalamine (LIALDA) 1.2 G EC tablet Take 1.2 g by mouth daily with breakfast.    . potassium chloride (K-DUR,KLOR-CON) 10 MEQ tablet Take 2 tablets by mouth daily.    . Calcium Carbonate-Vitamin D 600-400 MG-UNIT per tablet Take 1 tablet by mouth 2 (two) times daily.    . hydrochlorothiazide (HYDRODIURIL) 25 MG tablet Take 25 mg by mouth daily.    . metoprolol succinate (TOPROL-XL) 50 MG 24 hr tablet Take 50 mg by mouth daily. Take with or immediately following a meal.     No facility-administered medications prior to visit.     PAST MEDICAL HISTORY: Past Medical History:  Diagnosis Date  . Bell's palsy    20 years ago, affecting the left facial nerve  . CAD (coronary artery disease)   . Edema   . Essential hypertension, benign   . Hyperlipidemia   . Myocardial infarction 1996  . Obesity   . OSA (obstructive sleep apnea)    on CPAP,  12-19-05  . Ulcerative colitis (HCC)     PAST SURGICAL HISTORY: Past Surgical History:  Procedure Laterality Date  . Ulcerative colitis  04/2010   Hospitalization    FAMILY HISTORY: Family History  Problem Relation Age of Onset  . Kidney cancer Mother   . Heart attack Father   . Ovarian cancer Sister     SOCIAL HISTORY: Social History   Social History  . Marital status: Married    Spouse name: [name]  . Number of children: 2  . Years of education: 16   Occupational History  . Not on file.   Social History Main Topics  . Smoking status: Former Games developer  . Smokeless tobacco: Never Used  . Alcohol use 0.0 oz/week     Comment: 2 glasses of wine- nightly  . Drug use: No  . Sexual activity: Not on file   Other Topics Concern  . Not on file   Social History Narrative   Patient is married. [name])   Patient has two adult children.   Patient is retired.   Patient has a college education.   Patient is right-handed.   Patient does not drink any caffeine.            PHYSICAL  EXAM  Vitals:   12/11/16 1315  BP: 120/68  Pulse: 76  Resp: 16  Weight: 205 lb (93 kg)  Height: 5\' 10"  (1.778 m)   Body mass index is 29.41 kg/m.   MMSE - Mini Mental State Exam 05/06/2016  Orientation to time 5  Orientation to Place 5  Registration 3  Attention/ Calculation 5  Recall 3  Language- name 2 objects 2  Language- repeat 1  Language- follow 3 step command 3  Language- read & follow direction 1  Write a sentence 1  Copy design 0  Total score 29     Generalized: Well developed, in no acute distress   Neurological examination  Mentation: Alert oriented to time, place, history taking. Follows all commands speech and language fluent Cranial nerve :  Pupils were equal round reactive to light.  Extraocular movements were full, visual field were full on confrontational test.  Facial sensation and strength were normal. Uvula tongue midline. Head turning and shoulder shrug  were normal and symmetric. Motor: full strength of all 4 extremities. Good symmetric motor tone is noted throughout. Normal grip.  Sensory: Sensory testing is intact to soft touch on all 4 extremities.Coordination: intact  finger-nose-finger  bilaterally.  Gait and station: Gait is normal. Tandem gait is normal. \ Reflexes:  symmetric 2/2   Epworth Sleepiness Score 10 , FSS 29. Powernaps help. Rehab is main exercise. C;ock drawing 4/4 , word recall 5/5 . serial seven  Only first 2 are correct.  Red, Daisy, face, velvet,   Missed church 4/ 5    ASSESSMENT AND PLAN  71 y.o. year old male  has a past medical history of Bell's palsy; CAD (coronary artery disease); Edema; Essential hypertension, benign; Hyperlipidemia; Myocardial infarction (1996); Obesity; OSA (obstructive sleep apnea); and Ulcerative colitis (HCC). here with:  0. Status post CAD - Bypass surgery (10-03-2017)- had an MI at Thanksgiving, now feeling rested and cognitively improved.  1. Obstructive sleep on CPAP, auto titration. 2.  Mild memory disturbance, which has improved   The patient CPAP download documented excellent compliance   He continues to use his CPAP nightly.  The patient's memory score has also remained stable.  We will continue to monitor. He will follow-up in 12 months with [facility] with NP.   12/11/2016, 1:35 PM [facility] [address], [address] [place], [place] [address] [phone]

## 2017-10-07 ENCOUNTER — Ambulatory Visit
Admission: RE | Admit: 2017-10-07 | Discharge: 2017-10-07 | Disposition: A | Discharge status: Home / Self Care | Payer: Medicare Other | Source: Ambulatory Visit | Attending: Urology | Admitting: Urology

## 2017-10-07 ENCOUNTER — Other Ambulatory Visit: Payer: Self-pay | Admitting: Urology

## 2017-10-07 DIAGNOSIS — R3121 Asymptomatic microscopic hematuria: Secondary | ICD-10-CM

## 2017-10-07 DIAGNOSIS — Z8546 Personal history of malignant neoplasm of prostate: Secondary | ICD-10-CM

## 2017-10-07 MED ORDER — IOPAMIDOL (ISOVUE-300) INJECTION 61%
125.0000 mL | Freq: Once | INTRAVENOUS | Status: AC | PRN
  Administered 2017-10-07: 125 mL via INTRAVENOUS

## 2017-12-14 ENCOUNTER — Encounter: Payer: Self-pay | Admitting: Adult Health

## 2017-12-16 ENCOUNTER — Ambulatory Visit: Payer: Medicare Other | Admitting: Adult Health

## 2017-12-16 ENCOUNTER — Encounter: Payer: Self-pay | Admitting: Adult Health

## 2017-12-16 VITALS — BP 132/80 | HR 66 | Ht 70.0 in | Wt 210.8 lb

## 2017-12-16 DIAGNOSIS — Z9989 Dependence on other enabling machines and devices: Secondary | ICD-10-CM

## 2017-12-16 DIAGNOSIS — R413 Other amnesia: Secondary | ICD-10-CM | POA: Diagnosis not present

## 2017-12-16 DIAGNOSIS — G4733 Obstructive sleep apnea (adult) (pediatric): Secondary | ICD-10-CM | POA: Diagnosis not present

## 2017-12-16 NOTE — Patient Instructions (Signed)
Your Plan:  Continue cpap nightly Memory score is stable will continue to monitor If your symptoms worsen or you develop new symptoms please let us know.   Thank you for coming [facility] at [facility]. I hope we have been able to provide you high quality care today.  You may receive a patient satisfaction survey over the next few weeks. We would appreciate your feedback and comments so that we may continue to improve ourselves and the health of our patients.

## 2017-12-16 NOTE — Progress Notes (Signed)
PATIENT: Alejandro Sherman Ronald Tanguma DOB: [birth year]  REASON FOR VISIT: follow up HISTORY FROM: patient  HISTORY OF PRESENT ILLNESS: Today 12/16/17 Mr. Alejandro Sherman is a 72 year old male with a history of obstructive sleep apnea on CPAP.  He returns today for follow-up.  His CPAP download indicates that he use his machine 30 out of 30 days for compliance of 100%.  He uses machine greater than 4 hours each night.  On average he uses his machine 6 hours and 55 minutes.  His residual AHI is 1.8 on a minimum pressure of 5 cm of water and maximum pressure of 20 cm of water with EPR of 1.  His leak in the 95th percentile is 25.6 L/min.  Overall he feels that he is doing well.  His Epworth sleepiness score is 9 and fatigue severity score is 34.  The patient feels that his memory has remained stable.  His wife reports that she continues to notice some changes with his short-term memory.  She reports that he tends to forget conversations that they have had and some instructions that she has given him.  He is able to complete all ADLs independently.  He operates a Librarian, academicmotor vehicle without difficulty.  Although his wife reports that he is not as good with directions as he used to be.  He denies having to give up anything due to his memory.  He returns today for an evaluation.  HISTORY 12-11-2016,  I have the pleasure of seeing Mr. and Mrs. Feldner today, here for routine compliance visit with CPAP therapy. The patient has used her machine 30 out of 30 days with an average of 7 hours and 38 minutes, this is an AutoSet between 5 and 20 cm water pressure with a full-time EPR of 1 cm. The 95th percentile pressure was 10.5 cm water the residual AHI is excellent at 1.5 per hour of sleep there is no evidence of the therapy induced resurgence of central apneas. There is no need to change any of the settings. His [place]Med CP[place] [facility]. He is interested in a travel CPAP.  In the meantime he underwent a triple ByPass  surgery and is in cardiac rehab.    REVIEW OF SYSTEMS: Out of a complete 14 system review of symptoms, the patient complains only of the following symptoms, and all other reviewed systems are negative.  See HPI  ALLERGIES: Allergies  Allergen Reactions  . [place] Grass Extract Other (See Comments)    Sneezing, itchy watery eyes    HOME MEDICATIONS: Outpatient Medications Prior to Visit  Medication Sig Dispense Refill  . aspirin 81 MG tablet Take 81 mg by mouth daily.    Marland Kitchen. atorvastatin (LIPITOR) 40 MG tablet Take 40 mg by mouth daily.    . chlorthalidone (HYGROTON) 25 MG tablet TAKE 1 TABLET (25 MG TOTAL) BY MOUTH DAILY.  9  . clopidogrel (PLAVIX) 75 MG tablet Take 75 mg by mouth.    . ezetimibe (ZETIA) 10 MG tablet Take 10 mg by mouth daily.    . fluticasone (FLONASE) 50 MCG/ACT nasal spray Place 2 sprays into both nostrils daily.    Marland Kitchen. loratadine (CLARITIN) 10 MG tablet Take 10 mg by mouth.    . mesalamine (LIALDA) 1.2 G EC tablet Take 1.2 g by mouth daily with breakfast.    . metoprolol succinate (TOPROL-XL) 100 MG 24 hr tablet Take 100 mg by mouth.    . potassium chloride (K-DUR,KLOR-CON) 10 MEQ tablet Take 2  tablets by mouth daily.     No facility-administered medications prior to visit.     PAST MEDICAL HISTORY: Past Medical History:  Diagnosis Date  . Bell's palsy    20 years ago, affecting the left facial nerve  . CAD (coronary artery disease)   . Edema   . Essential hypertension, benign   . Hyperlipidemia   . Myocardial infarction (HCC) 1996  . Obesity   . OSA (obstructive sleep apnea)    on CPAP,  12-19-05  . Ulcerative colitis (HCC)     PAST SURGICAL HISTORY: Past Surgical History:  Procedure Laterality Date  . Ulcerative colitis  04/2010   Hospitalization    FAMILY HISTORY: Family History  Problem Relation Age of Onset  . Kidney cancer Mother   . Heart attack Father   . Ovarian cancer Sister     SOCIAL HISTORY: Social History   Socioeconomic  History  . Marital status: Married    Spouse name: Alejandro Sherman  . Number of children: 2  . Years of education: 70  . Highest education level: Not on file  Social Needs  . Financial resource strain: Not on file  . Food insecurity - worry: Not on file  . Food insecurity - inability: Not on file  . Transportation needs - medical: Not on file  . Transportation needs - non-medical: Not on file  Occupational History  . Not on file  Tobacco Use  . Smoking status: Former Games developer  . Smokeless tobacco: Never Used  Substance and Sexual Activity  . Alcohol use: Yes    Alcohol/week: 0.0 oz    Comment: 2 glasses of wine- nightly  . Drug use: No  . Sexual activity: Not on file  Other Topics Concern  . Not on file  Social History Narrative   Patient is married. Alejandro Sherman)   Patient has two adult children.   Patient is retired.   Patient has a college education.   Patient is right-handed.   Patient does not drink any caffeine.         PHYSICAL EXAM  Vitals:   12/16/17 1247  Weight: 210 lb 12.8 oz (95.6 kg)  Height: 5\' 10"  (1.778 m)   Body mass index is 30.25 kg/m.   MMSE - Mini Mental State Exam 05/06/2016  Orientation to time 5  Orientation to Place 5  Registration 3  Attention/ Calculation 5  Recall 3  Language- name 2 objects 2  Language- repeat 1  Language- follow 3 step command 3  Language- read & follow direction 1  Write a sentence 1  Copy design 0  Total score 29     Generalized: Well developed, in no acute distress   Neurological examination  Mentation: Alert oriented to time, place, history taking. Follows all commands speech and language fluent Cranial nerve II-XII: Pupils were equal round reactive to light. Extraocular movements were full, visual field were full on confrontational test. Facial sensation and strength were normal. Uvula tongue midline. Head turning and shoulder shrug  were normal and symmetric. Motor: The motor testing reveals 5 over 5 strength of  all 4 extremities. Good symmetric motor tone is noted throughout.  Sensory: Sensory testing is intact to soft touch on all 4 extremities. No evidence of extinction is noted.  Coordination: Cerebellar testing reveals good finger-nose-finger and heel-to-shin bilaterally.  Gait and station: Gait is normal. Tandem gait is normal. Romberg is negative. No drift is seen.  Reflexes: Deep tendon reflexes are symmetric and normal bilaterally.  DIAGNOSTIC DATA (LABS, IMAGING, TESTING) - I reviewed patient records, labs, notes, testing and imaging myself where available.  No results found for: WBC, HGB, HCT, MCV, PLT No results found for: NA, K, CL, CO2, GLUCOSE, BUN, CREATININE, CALCIUM, PROT, ALBUMIN, AST, ALT, ALKPHOS, BILITOT, GFRNONAA, GFRAA No results found for: CHOL, HDL, LDLCALC, LDLDIRECT, TRIG, CHOLHDL No results found for: WJXB1Y No results found for: VITAMINB12 No results found for: TSH    ASSESSMENT AND PLAN 72 y.o. year old male  has a past medical history of Bell's palsy, CAD (coronary artery disease), Edema, Essential hypertension, benign, Hyperlipidemia, Myocardial infarction (HCC) (1996), Obesity, OSA (obstructive sleep apnea), and Ulcerative colitis (HCC). here with :  1.  Obstructive sleep apnea on CPAP 2.  Memory disturbance  Patient CPAP download shows excellent compliance and good treatment of his apnea.  Memory score has remained the same.  He is encouraged to continue using the CPAP nightly.  We will continue to monitor his memory.  Advised that if symptoms worsen or he develops new symptoms they should let us know.  He will follow-up in 1 year or sooner if needed.    Butch Penny, MSN, NP-C 12/16/2017, 1:02 PM [facility] [address], [address] [place], [place] [address] [phone]

## 2017-12-16 NOTE — Progress Notes (Signed)
I have read the note, and I agree with the clinical assessment and plan.  Charles K Willis   

## 2018-12-07 ENCOUNTER — Encounter: Payer: Self-pay | Admitting: Neurology

## 2018-12-07 ENCOUNTER — Ambulatory Visit: Payer: Medicare Other | Admitting: Neurology

## 2018-12-07 VITALS — BP 126/71 | HR 57 | Ht 69.0 in | Wt 202.0 lb

## 2018-12-07 DIAGNOSIS — Z9989 Dependence on other enabling machines and devices: Secondary | ICD-10-CM | POA: Diagnosis not present

## 2018-12-07 DIAGNOSIS — G3184 Mild cognitive impairment, so stated: Secondary | ICD-10-CM | POA: Diagnosis not present

## 2018-12-07 DIAGNOSIS — G4733 Obstructive sleep apnea (adult) (pediatric): Secondary | ICD-10-CM | POA: Diagnosis not present

## 2018-12-07 DIAGNOSIS — I2583 Coronary atherosclerosis due to lipid rich plaque: Secondary | ICD-10-CM

## 2018-12-07 DIAGNOSIS — I2510 Atherosclerotic heart disease of native coronary artery without angina pectoris: Secondary | ICD-10-CM

## 2018-12-07 NOTE — Progress Notes (Signed)
PATIENT: Alejandro Sherman DOB: 1946-05-20  REASON FOR VISIT: follow up- obstructive sleep apnea, mild memory disturbance HISTORY FROM: patient   HISTORY 02/08/14 [facility]): Alejandro Sherman is a 73 y.o. caucasian, right handed, married male , who is seen here as a revisit ( from Dr. Bo Merino) for CPAP compliance.  Alejandro Sherman underwent a sleep study on 12-20-05, at the time with and excessive daytime sleepiness complaint, reflected and an Epworth score of 16 points. At the time of the study his BMI was 31.7 his neck circumference 18 inches and he had a history of hypertension, elevated cholesterol and nocturia. The study showed an AHI of 15.9 REM sleep was not noted prior to CPAP initiation the patient was then titrated to 8 cm water with an AHI of 0.0. The last time I had seen Alejandro Sherman was in March 2012, 3 years ago. He states he has continued to do well with his CPAP machine and is very compliant.  We were able to download his machine he and his average time of day he use of CPAP at 7 hours and 40 minutes, his compliance is 100% for 90 days and is residual AHI is 3.0. The machine is set between 4 and 20 cm water, his 95th percentile use is10.5 cm water. Since the patient has a moderate air leak and seems to benefit from this variable setting, I would not suggest to reduce the pressure window . He used to be followed by [facility]. He is now using an internet based supplier. He uses a nasal pillow.   Social History :  meanwhile retired Psychologist, occupational ( he was a Barista) and earlier this year his wife retired as well. He is no longer bound to an early rise time in the morning and has developed some changes in his sleep pattern.He likes to read and that normally would initiate sleep before midnight. He usually sleeps at night through and has nocturia breaks. He falls asleep rather promptly. He still wakes up spontaneously and does not require an alarm. He frequently has a dryish mouth  in the morning . He does not report any morning headaches.  He is usually refreshed and restored as he rises. He takes a regular breakfast at home, he does not use any caffeine at all.  Very rarely would he be napping daytime. His average time of sleep per night is approximately 8 hours. This is reflected in his time of CPAP use.  He exercises " religiously" , twice a week to silver [place]akers [place] [employer]. He walks daily a 2 mile distance. He uses a treat mill and elliptical trainer at [employer]. He doesn't regularly swim.  12-11-2016,  I have the pleasure of seeing [name] today, here for routine compliance visit with CPAP therapy. The patient has used her machine 30 out of 30 days with an average of 7 hours and 38 minutes, this is an AutoSet between 5 and 20 cm water pressure with a full-time EPR of 1 cm. The 95th percentile pressure was 10.5 cm water the residual AHI is excellent at 1.5 per hour of sleep there is no evidence of the therapy induced resurgence of central apneas. There is no need to change any of the settings. His ResMed CPAP was provided through the [place] system. He is interested in a travel CPAP.  In the meantime he underwent a triple ByPass surgery and is in cardiac rehab.   12-07-2018, RV for CPAP  compliance.  No medical history  Or medication changes, now off Plavix after bruising a lot, cardiologist took him off.  CPAP has been used compliantly again the data collected encompass 02 November 2018 through February 4, he has used the machine every of these 30 days and 29 days over 4 hours consecutively there was only one Friday, 24 January but he felt short of the 4-hour mark his average user time is 7 hours and 20 minutes, he is using an AutoSet between 5 and 20 cmH2O was 1 cm EPR, The machine was issued by the [place], and revealed a minimum pressure of 5 and a maximum pressure of 20 cmH2O was 1 cm expiratory pressure relief.  There are moderate air leaks the 95th percentile  pressure at night is 10.3 cmH2O and for this reason I would offer to reduce the upper pressure to 13 cm not allowing for erroneous apneas that may arise from air leak.   REVIEW OF SYSTEMS: Out of a complete 14 system review of symptoms, the patient complains only of the following symptoms, and all other reviewed systems are negative.  See history of present illness:  EPWORTH :  5 points    FATIGUE SEVERITY :  17 points.   ALLERGIES: Allergies  Allergen Reactions  . [place] Grass Extract Other (See Comments)    Sneezing, itchy watery eyes    HOME MEDICATIONS: Outpatient Medications Prior to Visit  Medication Sig Dispense Refill  . aspirin 81 MG tablet Take 81 mg by mouth daily.    Marland Kitchen atorvastatin (LIPITOR) 40 MG tablet Take 40 mg by mouth daily.    . chlorthalidone (HYGROTON) 25 MG tablet TAKE 1 TABLET (25 MG TOTAL) BY MOUTH DAILY.  9  . ezetimibe (ZETIA) 10 MG tablet Take 10 mg by mouth daily.    . fluticasone (FLONASE) 50 MCG/ACT nasal spray Place 2 sprays into both nostrils daily.    Marland Kitchen loratadine (CLARITIN) 10 MG tablet Take 10 mg by mouth.    . mesalamine (LIALDA) 1.2 G EC tablet Take 1.2 g by mouth daily with breakfast.    . metoprolol succinate (TOPROL-XL) 100 MG 24 hr tablet Take 100 mg by mouth.    . potassium chloride (K-DUR,KLOR-CON) 10 MEQ tablet Take 2 tablets by mouth daily.    . clopidogrel (PLAVIX) 75 MG tablet Take 75 mg by mouth.     No facility-administered medications prior to visit.     PAST MEDICAL HISTORY: Past Medical History:  Diagnosis Date  . Bell's palsy    20 years ago, affecting the left facial nerve  . CAD (coronary artery disease)   . Edema   . Essential hypertension, benign   . Hyperlipidemia   . Myocardial infarction (HCC) 1996  . Obesity   . OSA (obstructive sleep apnea)    on CPAP,  12-19-05  . Ulcerative colitis (HCC)     PAST SURGICAL HISTORY: Past Surgical History:  Procedure Laterality Date  . Ulcerative colitis  04/2010    Hospitalization    FAMILY HISTORY: Family History  Problem Relation Age of Onset  . Kidney cancer Mother   . Heart attack Father   . Ovarian cancer Sister     SOCIAL HISTORY: Social History   Socioeconomic History  . Marital status: Married    Spouse name: [name]  . Number of children: 2  . Years of education: 23  . Highest education level: Not on file  Occupational History  . Not on file  Social Needs  .  Financial resource strain: Not on file  . Food insecurity:    Worry: Not on file    Inability: Not on file  . Transportation needs:    Medical: Not on file    Non-medical: Not on file  Tobacco Use  . Smoking status: Former Games developermoker  . Smokeless tobacco: Never Used  Substance and Sexual Activity  . Alcohol use: Yes    Alcohol/week: 0.0 standard drinks    Comment: 2 glasses of wine- nightly  . Drug use: No  . Sexual activity: Not on file  Lifestyle  . Physical activity:    Days per week: Not on file    Minutes per session: Not on file  . Stress: Not on file  Relationships  . Social connections:    Talks on phone: Not on file    Gets together: Not on file    Attends religious service: Not on file    Active member of club or organization: Not on file    Attends meetings of clubs or organizations: Not on file    Relationship status: Not on file  . Intimate partner violence:    Fear of current or ex partner: Not on file    Emotionally abused: Not on file    Physically abused: Not on file    Forced sexual activity: Not on file  Other Topics Concern  . Not on file  Social History Narrative   Patient is married. [name](Pamela)   Patient has two adult children.   Patient is retired.   Patient has a college education.   Patient is right-handed.   Patient does not drink any caffeine.         PHYSICAL EXAM  Vitals:   12/07/18 0916  BP: 126/71  Pulse: (!) 57  Weight: 202 lb (91.6 kg)  Height: 5\' 9"  (1.753 m)   Body mass index is 29.83 kg/m.   MMSE - Mini  Mental State Exam 12/16/2017 05/06/2016  Orientation to time 5 5  Orientation to Place 4 5  Registration 3 3  Attention/ Calculation 5 5  Recall 3 3  Language- name 2 objects 2 2  Language- repeat 1 1  Language- follow 3 step command 3 3  Language- read & follow direction 1 1  Write a sentence 1 1  Copy design 1 0  Total score 29 29     Generalized: Well developed, in no acute distress   Neurological examination  Mentation: Alert oriented to time, place, history taking. Follows all commands speech and language fluent Cranial nerve : Pupils were equal round reactive to light.  Corrective glasses. Left ptosis.  Extraocular movements were full, visual field were full on confrontational test.  Facial sensation symmetric. Facial weakness, left side, Ptosis.   Uvula and tongue move in midline. Head turning and shoulder shrug  were symmetric. Motor: full strength of all 4 extremities, symmetric motor tone is noted throughout. Normal grip.  Sensory: intact to soft touch on all 4 extremities.Coordination: intact  finger-nose-finger  bilaterally.  Gait and station: Gait is normal. Tandem gait is normal. \ Reflexes:  symmetric 2/2   Epworth Sleepiness Score 10 , FSS 29. Powernaps help. Rehab is main exercise. C;ock drawing 4/4 , word recall 5/5 . serial seven  Only first 2 are correct.  Red, Daisy, face, velvet,   Missed church 4/ 5    ASSESSMENT AND PLAN  73 y.o. year old male  has a past medical history of Bell's palsy, CAD (coronary artery  disease), Edema, Essential hypertension, benign, Hyperlipidemia, Myocardial infarction (HCC) (1996), Obesity, OSA (obstructive sleep apnea), and Ulcerative colitis (HCC). here with:  0. 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 First MI Christmas 1996, while he was a smoker.  1. Obstructive sleep on CPAP, auto titration. Limit the pressure window to 13 cm water from 20 cm water.  2. Mild memory  disturbance, which has improved. Not retested today.  3. Ptosis left , [facility]. Bells Palsy.   The patient CPAP download documented excellent compliance   The patient's memory score has also remained stable. 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  We will continue to [facility].  He will follow-up in 12 months with [facility] with NP.   Melvyn Novasarmen Kamarius Buckbee, MD   12/07/2018, 9:44 AM [facility] [address], [address] [place], [place] [address] [phone]

## 2018-12-07 NOTE — Patient Instructions (Signed)

## 2018-12-16 ENCOUNTER — Ambulatory Visit: Payer: Medicare Other | Admitting: Adult Health

## 2019-11-25 ENCOUNTER — Ambulatory Visit: Payer: Medicare Other

## 2019-12-03 ENCOUNTER — Ambulatory Visit: Payer: Medicare Other | Attending: Internal Medicine

## 2019-12-03 DIAGNOSIS — Z23 Encounter for immunization: Secondary | ICD-10-CM

## 2019-12-03 NOTE — Progress Notes (Signed)
   Covid-19 Vaccination Clinic  Name:  Alejandro Sherman    MRN: [id number] DOB: 30-Sep-1946  12/03/2019  Alejandro Sherman was observed post Covid-19 immunization for 15 minutes without incidence. He was provided with Vaccine Information Sheet and instruction to access the V-Safe system.   Alejandro Sherman was instructed to call 911 with any severe reactions post vaccine: Marland Kitchen Difficulty breathing  . Swelling of your face and throat  . A fast heartbeat  . A bad rash all over your body  . Dizziness and weakness    Immunizations Administered    Name Date Dose VIS Date Route   Pfizer COVID-19 Vaccine 12/03/2019  3:01 PM 0.3 mL 10/08/2019 Intramuscular   Manufacturer: [employer], [employer]   Lot: [id number]   NDC: [address]-1

## 2019-12-13 ENCOUNTER — Ambulatory Visit: Payer: Medicare Other | Admitting: Adult Health

## 2019-12-13 ENCOUNTER — Other Ambulatory Visit: Payer: Self-pay

## 2019-12-13 ENCOUNTER — Encounter: Payer: Self-pay | Admitting: Adult Health

## 2019-12-13 VITALS — BP 122/74 | HR 62 | Temp 97.3°F | Ht 69.5 in | Wt 207.6 lb

## 2019-12-13 DIAGNOSIS — G4733 Obstructive sleep apnea (adult) (pediatric): Secondary | ICD-10-CM | POA: Diagnosis not present

## 2019-12-13 DIAGNOSIS — R413 Other amnesia: Secondary | ICD-10-CM

## 2019-12-13 DIAGNOSIS — Z9989 Dependence on other enabling machines and devices: Secondary | ICD-10-CM | POA: Diagnosis not present

## 2019-12-13 MED ORDER — DONEPEZIL HCL 5 MG PO TABS: 5.0000 mg | ORAL_TABLET | Freq: Every day | ORAL | 5 refills | Status: DC

## 2019-12-13 NOTE — Progress Notes (Signed)
PATIENT: Alejandro Sherman DOB: [birth year]  REASON FOR VISIT: follow up HISTORY FROM: patient  HISTORY OF PRESENT ILLNESS: Today 12/13/19:  Alejandro Sherman is a 74 year old male with a history of obstructive sleep apnea on CPAP and memory disturbance.  He returns today for follow-up.  His download indicates that he use his machine 30 out of 30 days for compliance of 100%.  He uses machine greater than 4 hours each night.  On average he uses his machine 6 hours and 56 minutes.  His residual AHI is two on 5 to 13 cm of water with EPR of one.  His leak in the 95th percentile is 24.6 L/min.  He reports that the CPAP continues to work well for him.  He continues to notice some changes with his memory.  Reports that he can walk into a room and forget what he went in for.  He does state eventually it will come to him.  He does have hearing aids but reports that he does not wear them consistently now that we have to wear a mask.  He is able to complete all ADLs independently.  He manages his own finances.  Denies any trouble cooking.  He is currently retired and lives at home with his wife.   REVIEW OF SYSTEMS: Out of a complete 14 system review of symptoms, the patient complains only of the following symptoms, and all other reviewed systems are negative.  Fatigue severity score 25 Epworth sleepiness score 6  ALLERGIES: Allergies  Allergen Reactions  . [place] Grass Extract Other (See Comments)    Sneezing, itchy watery eyes    HOME MEDICATIONS: Outpatient Medications Prior to Visit  Medication Sig Dispense Refill  . aspirin 81 MG tablet Take 81 mg by mouth daily.    Marland Kitchen atorvastatin (LIPITOR) 40 MG tablet Take 40 mg by mouth daily.    . chlorthalidone (HYGROTON) 25 MG tablet TAKE 1 TABLET (25 MG TOTAL) BY MOUTH DAILY.  9  . ezetimibe (ZETIA) 10 MG tablet Take 10 mg by mouth daily.    . fluticasone (FLONASE) 50 MCG/ACT nasal spray Place 2 sprays into both nostrils daily.    Marland Kitchen loratadine  (CLARITIN) 10 MG tablet Take 10 mg by mouth.    . mesalamine (LIALDA) 1.2 G EC tablet Take 1.2 g by mouth daily with breakfast.    . metoprolol succinate (TOPROL-XL) 100 MG 24 hr tablet Take 100 mg by mouth.    . potassium chloride (K-DUR,KLOR-CON) 10 MEQ tablet Take 4 tablets by mouth daily. "Taking 4 tablets by mouth 2 times daily"     No facility-administered medications prior to visit.    PAST MEDICAL HISTORY: Past Medical History:  Diagnosis Date  . Bell's palsy    20 years ago, affecting the left facial nerve  . CAD (coronary artery disease)   . Edema   . Essential hypertension, benign   . Hyperlipidemia   . Myocardial infarction (HCC) 1996  . Obesity   . OSA (obstructive sleep apnea)    on CPAP,  12-19-05  . Ulcerative colitis (HCC)     PAST SURGICAL HISTORY: Past Surgical History:  Procedure Laterality Date  . Ulcerative colitis  04/2010   Hospitalization    FAMILY HISTORY: Family History  Problem Relation Age of Onset  . Kidney cancer Mother   . Heart attack Father   . Ovarian cancer Sister     SOCIAL HISTORY: Social History   Socioeconomic History  . Marital status: Married  Spouse name: [name]  . Number of children: 2  . Years of education: 41  . Highest education level: Not on file  Occupational History  . Not on file  Tobacco Use  . Smoking status: Former Games developer  . Smokeless tobacco: Never Used  Substance and Sexual Activity  . Alcohol use: Yes    Alcohol/week: 0.0 standard drinks    Comment: 2 glasses of wine- nightly  . Drug use: No  . Sexual activity: Not on file  Other Topics Concern  . Not on file  Social History Narrative   Patient is married. [name])   Patient has two adult children.   Patient is retired.   Patient has a college education.   Patient is right-handed.   Patient does not drink any caffeine.      Social Determinants of Health   Financial Resource Strain:   . Difficulty of Paying Living Expenses: Not on file    Food Insecurity:   . Worried About Programme researcher, broadcasting/film/video in the Last Year: Not on file  . Ran Out of Food in the Last Year: Not on file  Transportation Needs:   . Lack of Transportation (Medical): Not on file  . Lack of Transportation (Non-Medical): Not on file  Physical Activity:   . Days of Exercise per Week: Not on file  . Minutes of Exercise per Session: Not on file  Stress:   . Feeling of Stress : Not on file  Social Connections:   . Frequency of Communication with Friends and Family: Not on file  . Frequency of Social Gatherings with Friends and Family: Not on file  . Attends Religious Services: Not on file  . Active Member of Clubs or Organizations: Not on file  . Attends Banker Meetings: Not on file  . Marital Status: Not on file  Intimate Partner Violence:   . Fear of Current or Ex-Partner: Not on file  . Emotionally Abused: Not on file  . Physically Abused: Not on file  . Sexually Abused: Not on file      PHYSICAL EXAM  Vitals:   12/13/19 1028  BP: 122/74  Pulse: 62  Temp: (!) 97.3 F (36.3 C)  Weight: 207 lb 9.6 oz (94.2 kg)  Height: 5' 9.5" (1.765 m)   Body mass index is 30.22 kg/m.   MMSE - Mini Mental State Exam 12/13/2019 12/16/2017 05/06/2016  Orientation to time 5 5 5   Orientation to Place 5 4 5   Registration 3 3 3   Attention/ Calculation 4 5 5   Recall 3 3 3   Language- name 2 objects 2 2 2   Language- repeat 1 1 1   Language- follow 3 step command 1 3 3   Language- read & follow direction 1 1 1   Write a sentence 1 1 1   Copy design 1 1 0  Copy design-comments named 15 animals - -  Total score 27 29 29      Generalized: Well developed, in no acute distress   Neurological examination  Mentation: Alert oriented to time, place, history taking. Follows all commands speech and language fluent Cranial nerve II-XII: Pupils were equal round reactive to light. Extraocular movements were full, visual field were full on confrontational test.  Head turning and shoulder shrug  were normal and symmetric. Motor: The motor testing reveals 5 over 5 strength of all 4 extremities. Good symmetric motor tone is noted throughout.  Sensory: Sensory testing is intact to soft touch on all 4 extremities. No evidence of  extinction is noted.  Coordination: Cerebellar testing reveals good finger-nose-finger and heel-to-shin bilaterally.  Gait and station: Gait is normal.  Reflexes: Deep tendon reflexes are symmetric and normal bilaterally.   DIAGNOSTIC DATA (LABS, IMAGING, TESTING) - I reviewed patient records, labs, notes, testing and imaging myself where available.     ASSESSMENT AND PLAN 74 y.o. year old male  has a past medical history of Bell's palsy, CAD (coronary artery disease), Edema, Essential hypertension, benign, Hyperlipidemia, Myocardial infarction ([place]) (1996), Obesity, OSA (obstructive sleep apnea), and Ulcerative colitis ([place]). here with :  1. Obstructive sleep apnea on CPAP 2. Memory disturbance  The patient's CPAP download shows excellent compliance and good treatment of his apnea.  He is encouraged to continue using CPAP nightly and greater than 4 hours each night.  I will check blood work regarding memory complaints.  The patient would like to try memory medication as a trial.  He will start Aricept 5 mg at bedtime.  I reviewed potential side effects with the patient and provided him with a handout.  He is advised that if his symptoms worsen or he develops new symptoms he should let us know.  He will follow-up in 6 months or sooner if needed    I spent 25 minutes with the patient. 50% of this time was spent reviewing the patient's chart discussing memory complaints and medication.   Ward Givens, MSN, NP-C 12/13/2019, 10:45 AM [facility] [address] [place], [place] [address] [phone]

## 2019-12-13 NOTE — Patient Instructions (Signed)
Your Plan:  Continue CPAP Start Aricept 5 mg at bedtime If your symptoms worsen or you develop new symptoms please let us know.   Thank you for coming to see [place] at [facility]. I hope we have been able to provide you high quality care today.  You may receive a patient satisfaction survey over the next few weeks. We would appreciate your feedback and comments so that we may continue to improve ourselves and the health of our patients.  Donepezil tablets What is this medicine? DONEPEZIL (doe NEP e zil) is used to treat mild to moderate dementia caused by Alzheimer's disease. This medicine may be used for other purposes; ask your health care provider or pharmacist if you have questions. COMMON BRAND NAME(S): Aricept What should I tell my health care provider before I take this medicine? They need to know if you have any of these conditions:  asthma or other lung disease  difficulty passing urine  head injury  heart disease  history of irregular heartbeat  liver disease  seizures (convulsions)  stomach or intestinal disease, ulcers or stomach bleeding  an unusual or allergic reaction to donepezil, other medicines, foods, dyes, or preservatives  pregnant or trying to get pregnant  breast-feeding How should I use this medicine? Take this medicine by mouth with a glass of water. Follow the directions on the prescription label. You may take this medicine with or without food. Take this medicine at regular intervals. This medicine is usually taken before bedtime. Do not take it more often than directed. Continue to take your medicine even if you feel better. Do not stop taking except on your doctor's advice. If you are taking the 23 mg donepezil tablet, swallow it whole; do not cut, crush, or chew it. Talk to your pediatrician regarding the use of this medicine in children. Special care may be needed. Overdosage: If you think you have taken too much of this  medicine contact a poison control center or emergency room at once. NOTE: This medicine is only for you. Do not share this medicine with others. What if I miss a dose? If you miss a dose, take it as soon as you can. If it is almost time for your next dose, take only that dose, do not take double or extra doses. What may interact with this medicine? Do not take this medicine with any of the following medications:  certain medicines for fungal infections like itraconazole, fluconazole, posaconazole, and voriconazole  cisapride  dextromethorphan; quinidine  dronedarone  pimozide  quinidine  thioridazine This medicine may also interact with the following medications:  antihistamines for allergy, cough and cold  atropine  bethanechol  carbamazepine  certain medicines for bladder problems like oxybutynin, tolterodine  certain medicines for Parkinson's disease like benztropine, trihexyphenidyl  certain medicines for stomach problems like dicyclomine, hyoscyamine  certain medicines for travel sickness like scopolamine  dexamethasone  dofetilide  ipratropium  NSAIDs, medicines for pain and inflammation, like ibuprofen or naproxen  other medicines for Alzheimer's disease  other medicines that prolong the QT interval (cause an abnormal heart rhythm)  phenobarbital  phenytoin  rifampin, rifabutin or rifapentine  ziprasidone This list may not describe all possible interactions. Give your health care provider a list of all the medicines, herbs, non-prescription drugs, or dietary supplements you use. Also tell them if you smoke, drink alcohol, or use illegal drugs. Some items may interact with your medicine. What should I watch for while using this medicine? Visit your  doctor or health care professional for regular checks on your progress. Check with your doctor or health care professional if your symptoms do not get better or if they get worse. You may get drowsy or  dizzy. Do not drive, use machinery, or do anything that needs mental alertness until you know how this drug affects you. What side effects may I notice from receiving this medicine? Side effects that you should report to your doctor or health care professional as soon as possible:  allergic reactions like skin rash, itching or hives, swelling of the face, lips, or tongue  feeling faint or lightheaded, falls  loss of bladder control  seizures  signs and symptoms of a dangerous change in heartbeat or heart rhythm like chest pain; dizziness; fast or irregular heartbeat; palpitations; feeling faint or lightheaded, falls; breathing problems  signs and symptoms of infection like fever or chills; cough; sore throat; pain or trouble passing urine  signs and symptoms of liver injury like dark yellow or brown urine; general ill feeling or flu-like symptoms; light-colored stools; loss of appetite; nausea; right upper belly pain; unusually weak or tired; yellowing of the eyes or skin  slow heartbeat or palpitations  unusual bleeding or bruising  vomiting Side effects that usually do not require medical attention (report to your doctor or health care professional if they continue or are bothersome):  diarrhea, especially when starting treatment  headache  loss of appetite  muscle cramps  nausea  stomach upset This list may not describe all possible side effects. Call your doctor for medical advice about side effects. You may report side effects to FDA at 1-800-FDA-1088. Where should I keep my medicine? Keep out of reach of children. Store at room temperature between 15 and 30 degrees C (59 and 86 degrees F). Throw away any unused medicine after the expiration date. NOTE: This sheet is a summary. It may not cover all possible information. If you have questions about this medicine, talk to your doctor, pharmacist, or health care provider.  2020 Elsevier/Gold Standard (2018-10-05  10:33:41)

## 2019-12-14 LAB — VITAMIN B12: Vitamin B-12: 413 pg/mL (ref 232–1245)

## 2019-12-14 LAB — TSH: TSH: 2.39 u[IU]/mL (ref 0.450–4.500)

## 2019-12-15 ENCOUNTER — Telehealth: Payer: Self-pay

## 2019-12-15 NOTE — Telephone Encounter (Signed)
Called and was able to go over pts recent labs directly with the patient per NP [facility] request. Pt didn't not have any questions nor concerns. Pt demonstrated understanding.   "Labs results are unremarkable. Please call patient with results. Ok to continue with Aricept"-NP MM

## 2019-12-28 ENCOUNTER — Ambulatory Visit: Payer: Medicare Other | Attending: Internal Medicine

## 2019-12-28 DIAGNOSIS — Z23 Encounter for immunization: Secondary | ICD-10-CM | POA: Insufficient documentation

## 2019-12-28 NOTE — Progress Notes (Signed)
   Covid-19 Vaccination Clinic  Name:  Alejandro Sherman    MRN: [id number] DOB: 04-Nov-1945  12/28/2019  Alejandro Sherman was observed post Covid-19 immunization for 15 minutes without incident. He was provided with Vaccine Information Sheet and instruction to access the V-Safe system.   Alejandro Sherman was instructed to call 911 with any severe reactions post vaccine: Marland Kitchen Difficulty breathing  . Swelling of face and throat  . A fast heartbeat  . A bad rash all over body  . Dizziness and weakness   Immunizations Administered    Name Date Dose VIS Date Route   Pfizer COVID-19 Vaccine 12/28/2019  2:11 PM 0.3 mL 10/08/2019 Intramuscular   Manufacturer: [employer], [employer]   Lot: [id number]   NDC: [address]-4

## 2019-12-30 ENCOUNTER — Telehealth: Payer: Self-pay | Admitting: Adult Health

## 2019-12-30 MED ORDER — DONEPEZIL HCL 5 MG PO TABS: 5.0000 mg | ORAL_TABLET | Freq: Every day | ORAL | 1 refills | Status: DC

## 2019-12-30 NOTE — Telephone Encounter (Signed)
Pt has called to inform that the [facility] has not received the script for his donepezil (ARICEPT) 5 MG tablet yet.  Pt has called to provide the fax# to [facility] pharmacy  [phone]

## 2019-12-30 NOTE — Telephone Encounter (Signed)
I spoke to pt and let him know.

## 2019-12-30 NOTE — Telephone Encounter (Addendum)
[facility]  Fax confirmation received.

## 2019-12-30 NOTE — Addendum Note (Signed)
Addended by: Hermenia Fiscal S on: 12/30/2019 11:53 AM   Modules accepted: Orders

## 2020-02-23 ENCOUNTER — Encounter: Payer: Self-pay | Admitting: Adult Health

## 2020-02-23 MED ORDER — DONEPEZIL HCL 5 MG PO TABS: 5.0000 mg | ORAL_TABLET | Freq: Every day | ORAL | 1 refills | Status: DC

## 2020-06-12 ENCOUNTER — Ambulatory Visit: Payer: Medicare Other | Admitting: Adult Health

## 2020-06-12 ENCOUNTER — Encounter: Payer: Self-pay | Admitting: Adult Health

## 2020-06-12 VITALS — BP 142/69 | HR 60 | Ht 69.5 in | Wt 200.8 lb

## 2020-06-12 DIAGNOSIS — Z9989 Dependence on other enabling machines and devices: Secondary | ICD-10-CM | POA: Diagnosis not present

## 2020-06-12 DIAGNOSIS — R413 Other amnesia: Secondary | ICD-10-CM | POA: Diagnosis not present

## 2020-06-12 DIAGNOSIS — G4733 Obstructive sleep apnea (adult) (pediatric): Secondary | ICD-10-CM

## 2020-06-12 NOTE — Patient Instructions (Signed)
Your Plan:  Continue aricept  Referral to neuropsychology for memory eval If your symptoms worsen or you develop new symptoms please let us know.      Thank you for coming to see [place] at [facility]. I hope we have been able to provide you high quality care today.  You may receive a patient satisfaction survey over the next few weeks. We would appreciate your feedback and comments so that we may continue to improve ourselves and the health of our patients.

## 2020-06-12 NOTE — Progress Notes (Signed)
PATIENT: Alejandro Sherman DOB: 25-Jul-1946  REASON FOR VISIT: follow up HISTORY FROM: patient  HISTORY OF PRESENT ILLNESS: Today 06/12/20 :  Alejandro Sherman is a 74 year old male with a history of obstructive sleep apnea on CPAP and memory disturbance.  He returns today to discuss his memory.  He reports that when he initially started Aricept he noticed a change in his memory and attentiveness.  He also reports that his wife noticed this as well.  He states that since then these benefits have leveled off.  He has noticed some changes in his mood and behavior especially towards his wife.  He also notices some forgetfulness and trouble with his attention.  He returns today for an evaluation.  HISTORY 12/13/19:  Alejandro Sherman is a 74 year old male with a history of obstructive sleep apnea on CPAP and memory disturbance.  He returns today for follow-up.  His download indicates that he use his machine 30 out of 30 days for compliance of 100%.  He uses machine greater than 4 hours each night.  On average he uses his machine 6 hours and 56 minutes.  His residual AHI is two on 5 to 13 cm of water with EPR of one.  His leak in the 95th percentile is 24.6 L/min.  He reports that the CPAP continues to work well for him.  He continues to notice some changes with his memory.  Reports that he can walk into a room and forget what he went in for.  He does state eventually it will come to him.  He does have hearing aids but reports that he does not wear them consistently now that we have to wear a mask.  He is able to complete all ADLs independently.  He manages his own finances.  Denies any trouble cooking.  He is currently retired and lives at home with his wife.  REVIEW OF SYSTEMS: Out of a complete 14 system review of symptoms, the patient complains only of the following symptoms, and all other reviewed systems are negative.  See HPI  ALLERGIES: Allergies  Allergen Reactions  . [place] Grass Extract Other (See  Comments)    Sneezing, itchy watery eyes    HOME MEDICATIONS: Outpatient Medications Prior to Visit  Medication Sig Dispense Refill  . aspirin 81 MG tablet Take 81 mg by mouth daily.    Marland Kitchen atorvastatin (LIPITOR) 80 MG tablet Take 80 mg by mouth daily.    . chlorthalidone (HYGROTON) 25 MG tablet TAKE 1 TABLET (25 MG TOTAL) BY MOUTH DAILY.  9  . donepezil (ARICEPT) 5 MG tablet Take 1 tablet (5 mg total) by mouth at bedtime. 90 tablet 1  . ezetimibe (ZETIA) 10 MG tablet Take 10 mg by mouth daily.    . fluticasone (FLONASE) 50 MCG/ACT nasal spray Place 2 sprays into both nostrils daily.    Marland Kitchen loratadine (CLARITIN) 10 MG tablet Take 10 mg by mouth.    . mesalamine (LIALDA) 1.2 G EC tablet Take 1.2 g by mouth daily with breakfast.    . metoprolol succinate (TOPROL-XL) 100 MG 24 hr tablet Take 100 mg by mouth.    . potassium chloride SA (KLOR-CON) 20 MEQ tablet Take 20 mEq by mouth 2 (two) times daily.    Marland Kitchen atorvastatin (LIPITOR) 40 MG tablet Take 40 mg by mouth daily.    . potassium chloride (K-DUR,KLOR-CON) 10 MEQ tablet Take 4 tablets by mouth daily. "Taking 4 tablets by mouth 2 times daily"  No facility-administered medications prior to visit.    PAST MEDICAL HISTORY: Past Medical History:  Diagnosis Date  . Bell's palsy    20 years ago, affecting the left facial nerve  . CAD (coronary artery disease)   . Edema   . Essential hypertension, benign   . Hyperlipidemia   . Myocardial infarction (HCC) 1996  . Obesity   . OSA (obstructive sleep apnea)    on CPAP,  12-19-05  . Ulcerative colitis (HCC)     PAST SURGICAL HISTORY: Past Surgical History:  Procedure Laterality Date  . Ulcerative colitis  04/2010   Hospitalization    FAMILY HISTORY: Family History  Problem Relation Age of Onset  . Kidney cancer Mother   . Heart attack Father   . Ovarian cancer Sister     SOCIAL HISTORY: Social History   Socioeconomic History  . Marital status: Married    Spouse name: [name]   . Number of children: 2  . Years of education: 28  . Highest education level: Not on file  Occupational History  . Not on file  Tobacco Use  . Smoking status: Former Games developer  . Smokeless tobacco: Never Used  Substance and Sexual Activity  . Alcohol use: Yes    Alcohol/week: 0.0 standard drinks    Comment: 2 glasses of wine- nightly  . Drug use: No  . Sexual activity: Not on file  Other Topics Concern  . Not on file  Social History Narrative   Patient is married. [name])   Patient has two adult children.   Patient is retired.   Patient has a college education.   Patient is right-handed.   Patient does not drink any caffeine.      Social Determinants of Health   Financial Resource Strain:   . Difficulty of Paying Living Expenses:   Food Insecurity:   . Worried About Programme researcher, broadcasting/film/video in the Last Year:   . Barista in the Last Year:   Transportation Needs:   . Freight forwarder (Medical):   Marland Kitchen Lack of Transportation (Non-Medical):   Physical Activity:   . Days of Exercise per Week:   . Minutes of Exercise per Session:   Stress:   . Feeling of Stress :   Social Connections:   . Frequency of Communication with Friends and Family:   . Frequency of Social Gatherings with Friends and Family:   . Attends Religious Services:   . Active Member of Clubs or Organizations:   . Attends Banker Meetings:   Marland Kitchen Marital Status:   Intimate Partner Violence:   . Fear of Current or Ex-Partner:   . Emotionally Abused:   Marland Kitchen Physically Abused:   . Sexually Abused:       PHYSICAL EXAM  Vitals:   06/12/20 1347  BP: (!) 142/69  Pulse: 60  Weight: 200 lb 12.8 oz (91.1 kg)  Height: 5' 9.5" (1.765 m)   Body mass index is 29.23 kg/m.   MMSE - Mini Mental State Exam 06/12/2020 12/13/2019 12/16/2017  Orientation to time 5 5 5   Orientation to Place 5 5 4   Registration 3 3 3   Attention/ Calculation 5 4 5   Recall 3 3 3   Language- name 2 objects 2 2 2    Language- repeat 1 1 1   Language- follow 3 step command 3 1 3   Language- read & follow direction 1 1 1   Write a sentence 1 1 1   Copy design 0 1 1  Copy design-comments - named 15 animals -  Total score 29 27 29      Generalized: Well developed, in no acute distress  Chest: Lungs clear to auscultation bilaterally  Neurological examination  Mentation: Alert oriented to time, place, history taking. Follows all commands speech and language fluent Cranial nerve II-XII: Extraocular movements were full, visual field were full on confrontational test Head turning and shoulder shrug  were normal and symmetric. Motor: The motor testing reveals 5 over 5 strength of all 4 extremities. Good symmetric motor tone is noted throughout.  Sensory: Sensory testing is intact to soft touch on all 4 extremities. No evidence of extinction is noted.  Gait and station: Gait is normal.    DIAGNOSTIC DATA (LABS, IMAGING, TESTING) - I reviewed patient records, labs, notes, testing and imaging myself where available.   Lab Results  Component Value Date   VITAMINB12 413 12/13/2019   Lab Results  Component Value Date   TSH 2.390 12/13/2019      ASSESSMENT AND PLAN 74 y.o. year old male  has a past medical history of Bell's palsy, CAD (coronary artery disease), Edema, Essential hypertension, benign, Hyperlipidemia, Myocardial infarction (HCC) (1996), Obesity, OSA (obstructive sleep apnea), and Ulcerative colitis (HCC). here with:  Memory disturbance   Continue Aricept  MMSE is stable  Referral to neuropsychology for memory evaluation  Patient has never had any brain imaging may consider after neuropsychological evaluation.  Physical exam unremarkable  Follow-up in 6 months or sooner if needed  Obstructive sleep apnea on CPAP   Continue CPAP therapy  I spent 20  minutes of face-to-face and non-face-to-face time with patient.  This included previsit chart review, lab review, study review,  order entry, electronic health record documentation, patient education.  65, MSN, NP-C 06/12/2020, 2:08 PM [facility] [address] [place], [place] [phone]

## 2020-06-14 ENCOUNTER — Encounter: Payer: Self-pay | Admitting: Psychology

## 2020-06-22 ENCOUNTER — Encounter: Payer: Medicare Other | Attending: Psychology | Admitting: Psychology

## 2020-06-22 ENCOUNTER — Other Ambulatory Visit: Payer: Self-pay

## 2020-06-22 DIAGNOSIS — G4733 Obstructive sleep apnea (adult) (pediatric): Secondary | ICD-10-CM | POA: Diagnosis not present

## 2020-06-22 DIAGNOSIS — G3184 Mild cognitive impairment, so stated: Secondary | ICD-10-CM

## 2020-06-22 DIAGNOSIS — Z9989 Dependence on other enabling machines and devices: Secondary | ICD-10-CM | POA: Diagnosis not present

## 2020-06-22 NOTE — Progress Notes (Signed)
NEUROBEHAVIORAL STATUS EXAM   Name: Alejandro Sherman Date of Birth: 06-07-1946 Date of Interview: 06/22/2020  Reason for Referral Alejandro Sherman is a 74 y.o. male who is referred for neuropsychological evaluation by Nurse Practitioner Butch Penny of [facility]  due to concerns about his memory and current level of cognitive functioning. This patient is accompanied in the office by his wife who supplements the history.  History of Presenting Problem:  Alejandro Sherman was interviewed separately from his wife, who also served as an informant. He described increased forgetfulness and mood changes such as irritability "to the point of being critical" within the past year. He admitted to feeling somewhat more socially withdrawn and that he tends to isolate more to avoid conflict with wife or others. He states that his short-term memory and attention/concentration are "getting worse as time goes on," and is somewhat concerned about these cognitive changes.  He admitted to problems with remembering names and recent conversations, misplacing things around the house and forgetting the purpose for going into a room on an almost daily basis. He denied problems with long term memory, recognizing faces, navigation, getting lost, driving, following directions, organization, judgment, managing finances, etc.    He explains that he and wife share responsibility for paying the bills and denied any trouble with IADLS or basic tasks. He is sleeps 7-8 hours a night and denies any neurovegetative signs. He was diagnosed with OSA many years ago and reported being fully compliant with CPAP. He and his wife sleep in separate bedrooms due to the noise of his machine. He admitted to having nightmares on occasion, both related and unrelated to experiences in [place]. 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 He currently receives monthly disability for several service  connected conditions including exposure to agent orange. He does have hearing aids but denied wearing them on a consistent basis, especially more recently when wearing a mask.  The patient's wife recalled first noticing personality and mood changes around 8 years ago and described gradual decline since. However, she reported an increase in anger within the past year and stated that he ruminates about news related to the [employer] or [place] foreign policy, including recent events in [place]. She described him as a happy and positive person with generally stable mood prior to around 8 years ago with the exception of the first year or so after returning home from [place].   She identified long term memory as a relative strength compared to recalling more recent information such as conversations. Contrary to patient's report, his wife purportedly managed all finances; he has forgotten to pay bills in the past. She confirmed that he does still drive but stated that she often refused to sit as passenger due to road rage and excessive speeding. The patient is taking care of his normal day-to-day activities including bathing, dressing and cooking.  There are no reports of falls.    Work: Retired. 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   Psychiatric History: History of depression, anxiety, other MH disorder: Denied History of MH treatment: Denied History of SI: Denied History of substance dependence/treatment: Denied   Social History: Born/Raised: [place], [place] Education: B.A. in Business Administration Occupational history: 42 years in Therapist, nutritional. 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 Children:2 grown children along with 3 grandchildren  Alcohol: He denied  history of abuse/dependance and admitted to currently drinking around 4-6 standard alcoholic beverages over the  course of most weekends.  Tobacco: He quit smoking cigarettes in December 1996. SA: Denied   Medical History: Past Medical History:  Diagnosis Date  . Bell's palsy    20 years ago, affecting the left facial nerve  . CAD (coronary artery disease)   . Edema   . Essential hypertension, benign   . Hyperlipidemia   . Myocardial infarction (HCC) 1996  . Obesity   . OSA (obstructive sleep apnea)    on CPAP,  12-19-05  . Ulcerative colitis (HCC)     Current Medications:  Outpatient Encounter Medications as of 06/22/2020  Medication Sig  . aspirin 81 MG tablet Take 81 mg by mouth daily.  Marland Kitchen atorvastatin (LIPITOR) 80 MG tablet Take 80 mg by mouth daily.  . chlorthalidone (HYGROTON) 25 MG tablet TAKE 1 TABLET (25 MG TOTAL) BY MOUTH DAILY.  Marland Kitchen donepezil (ARICEPT) 5 MG tablet Take 1 tablet (5 mg total) by mouth at bedtime.  Marland Kitchen ezetimibe (ZETIA) 10 MG tablet Take 10 mg by mouth daily.  . fluticasone (FLONASE) 50 MCG/ACT nasal spray Place 2 sprays into both nostrils daily.  Marland Kitchen loratadine (CLARITIN) 10 MG tablet Take 10 mg by mouth.  . mesalamine (LIALDA) 1.2 G EC tablet Take 1.2 g by mouth daily with breakfast.  . metoprolol succinate (TOPROL-XL) 100 MG 24 hr tablet Take 100 mg by mouth.  . potassium chloride SA (KLOR-CON) 20 MEQ tablet Take 20 mEq by mouth 2 (two) times daily.   No facility-administered encounter medications on file as of 06/22/2020.   Behavioral Observations:   Appearance: Neatly, casually and appropriately dressed and groomed Gait: Ambulated independently, no gross abnormalities observed Speech: Fluent; normal rate, rhythm and volume. Mild word finding difficulty. Thought process: Linear, goal directed, logical Affect: Full, mostly euthymic Interpersonal: Pleasant, appropriate  60 minutes spent face-to-face with patient completing neurobehavioral status exam and clinical interview.  60 minutes spent integrating medical records/clinical data and completing this  report. [id number] unit; [id number].  TESTING: There is medical necessity to proceed with neuropsychological assessment as the results will be used to aid in differential diagnosis and clinical decision-making and to inform specific treatment recommendations. Per the patient, wife and medical records reviewed, there has been a change in cognitive functioning and a reasonable suspicion of cognitive decline and possibly dementia.   Clinical Decision Making: In considering the patient's current level of functioning, level of presumed impairment, nature of symptoms, emotional and behavioral responses during the interview, level of literacy, and observed level of motivation, a battery of tests was selected to be administered during scheduled testing appointment.   PLAN: The patient will return to complete the above referenced full battery of neuropsychological testing with this provider. Education regarding testing procedures was provided to the patient. Subsequently, the patient will see this provider for a follow-up session at which time his test performances and my impressions and treatment recommendations will be reviewed in detail.   Evaluation ongoing; full report to follow.

## 2020-07-03 ENCOUNTER — Encounter: Payer: Self-pay | Admitting: Psychology

## 2020-07-13 ENCOUNTER — Other Ambulatory Visit: Payer: Self-pay

## 2020-07-13 ENCOUNTER — Encounter: Payer: Medicare Other | Attending: Psychology | Admitting: Psychology

## 2020-07-13 ENCOUNTER — Encounter: Payer: Self-pay | Admitting: Psychology

## 2020-07-13 DIAGNOSIS — Z9989 Dependence on other enabling machines and devices: Secondary | ICD-10-CM | POA: Diagnosis present

## 2020-07-13 DIAGNOSIS — G4733 Obstructive sleep apnea (adult) (pediatric): Secondary | ICD-10-CM | POA: Diagnosis present

## 2020-07-13 DIAGNOSIS — G3184 Mild cognitive impairment, so stated: Secondary | ICD-10-CM | POA: Insufficient documentation

## 2020-07-13 NOTE — Progress Notes (Signed)
   Neuropsychology Note  Alejandro Sherman completed 120 minutes of neuropsychological testing with this provider. The patient did not appear overtly distressed by the testing session, per behavioral observation or via self-report. Rest breaks were offered. He gave good effort and persisted well overall.   Tests Administered:    Scientist, research (life sciences) (ANT1)   [place] Naming Test (BNT)  [place] Verbal Learning Test, 3rd Edition (CVLT-3), Standard Form   Controlled Oral Word Association Test (COWAT)  Rey Complex Figure Test (RCFT),   Trail Making Test (TMT)  Wechsler Adult Intelligence Scale, 4th Edition (WAIS-IV), Select subtests    Alejandro Sherman will return wian interactive feedback session with this provider on 07/21/20 at which time his test performances, clinical impressions and treatment recommendations will be reviewed in detail. The patient understands he can contact our office should he require our assistance before this time.  Full report to follow.

## 2020-07-17 ENCOUNTER — Other Ambulatory Visit: Payer: Self-pay

## 2020-07-17 ENCOUNTER — Encounter (HOSPITAL_BASED_OUTPATIENT_CLINIC_OR_DEPARTMENT_OTHER): Payer: Medicare Other | Admitting: Psychology

## 2020-07-17 DIAGNOSIS — F0150 Vascular dementia without behavioral disturbance: Secondary | ICD-10-CM

## 2020-07-17 DIAGNOSIS — F01A0 Vascular dementia, mild, without behavioral disturbance, psychotic disturbance, mood disturbance, and anxiety: Secondary | ICD-10-CM

## 2020-07-17 DIAGNOSIS — G3184 Mild cognitive impairment, so stated: Secondary | ICD-10-CM | POA: Diagnosis not present

## 2020-07-21 ENCOUNTER — Encounter: Payer: Medicare Other | Admitting: Psychology

## 2020-07-21 ENCOUNTER — Encounter: Payer: Self-pay | Admitting: Psychology

## 2020-07-21 ENCOUNTER — Other Ambulatory Visit: Payer: Self-pay

## 2020-07-21 DIAGNOSIS — F0150 Vascular dementia without behavioral disturbance: Secondary | ICD-10-CM

## 2020-07-21 DIAGNOSIS — Z9989 Dependence on other enabling machines and devices: Secondary | ICD-10-CM | POA: Diagnosis not present

## 2020-07-21 DIAGNOSIS — G4733 Obstructive sleep apnea (adult) (pediatric): Secondary | ICD-10-CM | POA: Diagnosis not present

## 2020-07-21 DIAGNOSIS — G3184 Mild cognitive impairment, so stated: Secondary | ICD-10-CM | POA: Diagnosis not present

## 2020-07-21 DIAGNOSIS — F01A0 Vascular dementia, mild, without behavioral disturbance, psychotic disturbance, mood disturbance, and anxiety: Secondary | ICD-10-CM

## 2020-07-21 NOTE — Progress Notes (Signed)
Neuropsychology Feedback Appointment  Alejandro Sherman and his wife returned for a feedback appointment today to review the results of his recent neuropsychological evaluation with this provider. 60 minutes face-to-face time was spent reviewing his test results, my impressions and my recommendations as detailed in his report. Education was provided about his vascular risk factors. The patient and wife were given the opportunity to ask questions, and I did my best to answer these to their satisfaction.    Below you will find a summary of his test results, my clinical impressions, and recommendations. The full neuropsychological report can be found in his chart dated 07/17/20.   Test Results: Note: Standardized scores are presented only for use by appropriately trained professionals and to allow for any future test-retest comparison. These scores should not be interpreted without consideration of all the information that is contained in the rest of the report. The most recent standardization samples from the test publisher or other sources were used whenever possible to derive standard scores; scores were corrected for age, gender, ethnicity and education when available.   TEST SCORES:  Note: This summary of test scores accompanies the interpretive report and should not be considered in isolation without reference to the appropriate sections in the text. Descriptors are based on appropriate normative data and may be adjusted based on clinical judgment. The terms "impaired" and "within normal limits (WNL)" are used when a more specific level of functioning cannot be determined.    Validity Testing:     Descriptor  WAIS-IV Reliable Digit Span: --- --- Within Expectation  CVLT-III Forced Choice Recognition: --- --- Within Expectation  RCFT Combination Effort Score: --- --- Within Expectation         Intellectual Functioning:        Wechsler Adult Intelligence Scale (WAIS-IV):  Standard  Score/ Scaled Score Percentile    Verbal Comprehension Index: 122 93 Superior  Similarities  12 75 Above Average  Information  16 98 Exceptionally High  Perceptual Reasoning Index:  92 30 Average  Matrix Reasoning  9 37 Average  Visual Puzzles 8 25 Average  Working Memory Index: 92 30 Average  Digit Span 10 50 Average  Arithmetic  7 16 Below Average  Processing Speed Index: 102 55 Average  Symbol Search  11 63 Average  Coding 10 50 Average         Wechsler Adult Intelligence Scale (WAIS-IV) Short Form*: Standard Score/ Scaled Score Percentile    Full Scale IQ  108 70 Average  Similarities 12 75 Above Average  Information  16 98 Exceptionally High  Visual Puzzles 8 25 Average  Digit Span 10 50 Average  Coding 10 50 Average  *From [employer] (2009)             Memory:               [place] Verbal Learning Test (CVLT-III), Standard Form: Raw Score (Scaled/Standard Score) Percentile    Total Trials 1-5 50/80 (113) 81 Above Average  List B 6/16 (13) 84 Above Average  Short-Delay Free Recall 11/16 (13) 84 Above Average  Short-Delay Cued Recall 14/16 (14) 91 Above Average  Long Delay Free Recall 13/16 (14) 91 Above Average  Long Delay Cued Recall 14/16 (14) 91 Above Average  Discriminability 3.6 (14) 91 Above Average  Recognition Hits 15/16 (12) 75 Above Average  False Positive Errors 0 (14) 91 Above Average         Rey-Osterrieth Complex Figure Test (RCFT): Raw  Score (T Score) Percentile    Immediate Recall 15.5/36 (55) 69 Average  Delayed Recall 13/36 (49) 46 Average  Recognition Total Correct 21/24 (55) 69 Average  True Positives 9 >16 Within Normal Limits  False Positives 0 >16 Within Normal Limits         Attention/Executive Function:               Trail Making Test (TMT): Raw Score (T Score) Percentile    Part A 44 secs.,  errors (43) 25 Average  Part B 162 secs.,  errors (33) 5 Well Below Average    Scaled Score Percentile    WAIS-IV Coding: 10 50 Average            Scaled Score Percentile    WAIS-IV Digit Span: 10 50 Average  Forward 12 75 Above Average  Backward 9 37 Average  Sequencing 9 37 Average           Scaled Score Percentile    WAIS-IV Similarities: 12 75 Above Average         Language:        Verbal Fluency Test: Raw Score (T Score) Percentile    Phonemic Fluency (FAS) 27 (38) 12 Below Average  Animal Fluency 18 (47) 38 Average    Raw Score (T Score) Percentile    [place] Naming Test (BNT): 59/60 (66) 95 Well Above Average         Visuospatial/Visuoconstruction:          Raw Score Percentile    RCFT, Copy: 36/36 >16 Within Normal Limits           Scaled Score Percentile    WAIS-IV Matrix Reasoning: 9 37 Average  WAIS-IV Visual Puzzles: 8 25 Average          Description of Test Results: 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 Psychomotor processing speed was average. Auditory attention and working memory were average. Visual-spatial construction was average. Language abilities were intact. Specifically, confrontation naming was superior and semantic verbal fluency was average With regard to verbal memory, encoding and acquisition of non-contextual information (i.e., word list) was above average. After a brief distracter task, free recall remained above average. After a delay, free recall was above average. Cued recall was above average. Performance on a yes/no recognition task was average. With regard to non-verbal memory, delayed free recall of visual information was average. Executive functioning was relatively weak overall. Mental flexibility and set-shifting were mildly impaired on Trails B. Verbal fluency with phonemic search restrictions was below average.  Verbal abstract reasoning was above average. Non-verbal abstract reasoning was average.   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 However, there are a few areas of mild impairment suggestive of mild fronto-subcortical dysfunction (e.g., mildly impaired phonemic verbal fluency, and aspects of executive functioning involving cognitive flexibility and set shifting). 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 Based on his medical history including multiple vascular risk factors (e.g., CAD, history of MI, essential hypertension, sleep apnea, etc.) and performance on testing, a vascular etiology is suspected.   Recommendations: Based on the findings of the present evaluation, the  following recommendations are offered:  1. The patient is encouraged to attend to lifestyle factors for brain health (e.g., regular physical exercise, good nutrition habits, regular participation in cognitively-stimulating activities, and general stress management techniques), which are likely to have benefits for both emotional adjustment and cognition.  In fact, in addition to promoting general good health, regular exercise incorporating aerobic activities (e.g., brisk walking, jogging, bicycling, etc.) has been demonstrated to be a very effective treatment for depression and stress, with similar efficacy rates to both antidepressant medication and psychotherapy. And for those with orthopedic issues, water aerobics may be particularly beneficial.  2. Optimal control of vascular risk factors is necessary to reduce the risk of further cognitive decline.   3. Included with the report is a flyer that includes activities that have therapeutic value and might be useful to keep you cognitively stimulated; these lists  were compiled by therapists at the [facility], and are categorized by level of difficulty; this is not a substitute for therapy. This information can also be found at the following:  [email or web address]-to-[email or web address]-rehab-apps-[email or web address]-games/  4. Nutritional factors can have a significant effect on psychological and emotional status, as well as overall brain functioning.  The following general recommendations have been associated with improvements in depression and other psychological symptoms, as well as lower risk for dementia and other forms of cognitive impairment.  Please discuss these recommendations with your physician and/or dietitian before initiating:  . Consume a wide variety of fresh fruits and vegetables, particularly including brightly colored items such as berries, oranges, tomatoes, peppers, carrots, broccoli, spinach, dark green lettuces, sweet potatoes, etc., all of which are high in vitamins and antioxidants.  . Consume foods that are high in fiber, such as legumes (e.g., beans, peas, lentils) and foods made from whole grains (e.g., whole wheat bread and pasta)  . Consume a significant amount of omega-3 essential fats and oils.  These can be found in natural food sources such as salmon and other fatty fish, and also products made from flax seed and flax seed oil.  Alternately, dietary supplementation with fish oil capsules and flax seed oil capsules is a good way to boost one's level of omega-3 consumption.  It is important to check with your doctor before taking these supplements, especially if you take blood thinning medication.  . If you do not already do so, consider taking a quality multivitamin supplement under the guidance of your primary care physician.   . Consider keeping consumption of the following foods to a minimum: 1) foods made from white flour and white sugar; 2) artificial sweeteners; 3)  deep-fried foods; 4) animal fat other than fish; 5) any foods containing "hydrogenated" or "trans" fats; 6) most other types of highly processed packaged/prepared foods.   5. Neuropsychological re-evaluation is recommended in approximately one year (or sooner) to assist with the management of the patient,  determine any clinical and functional significance of brain abnormality over time, as well as to document any potential improvement or decline in cognitive functioning. Lastly, any follow-up testing will help delineate the specific cognitive basis of any new functional complaints.   The following are several strategies that may help:  . Performance will generally be best in a structured, routine, and familiar environment, as opposed to situations involving complex problems.  Charlene Brooke a place to keep your keys, wallet, cell phone, and other personal belongings. . Take time to register and process information to be remembered. Deeper encoding of information can be gained by forming  a mental picture, making meaningful associations, connecting new information to previously learned and related information, paraphrasing and repetition.  . To the extent possible, multitasking should be avoided; break down tasks into smaller steps to help get started and to keep from feeling overwhelmed. And if there are difficulties in organization and planning, maintaining a daily organizer to help keep track of important appointments and information may be beneficial.   . Memory problems may at least be minimally addressed using compensatory strategies such as the use of a daily schedule to follow, memos, portable recorder, a centrally located bulletin board, or memory notebook. A large calendar, placed in a highly visible location would be valuable to keep track of dates and appointments.  In addition, it would be helpful to keep a log of all of medical appointments with the name of the doctor, date of visit, diagnoses,  and treatments.  . Use of a medication box is recommended to ensure compliance and decrease confusion regarding medication dosages, times, and dates. . To aid in managing problems with attention, the patient may consider using some of the following strategies: o The patient should simplify tasks.  There may be a need to break overly complex activities into simple step-by-step tasks, keep these steps written down in a note book and then check them off as they are completed which will help to stay on task and make sure the whole task is finished.  o The patient should set deadlines for everything, even for seemingly small tasks, prioritize time-sensitive tasks and write down every assignment, message, or important thought. o The patient is encouraged to use timers and alarms to stay on track and take breaks at regular intervals. Avoid piles of paperwork or procrastination by dealing with each item as it comes in.  In general, the best way to manage the behavioral and psychological symptoms of dementia such as agitation involves behavioral strategies such as using the three R's.  o Redirection (help distract your loved one by focusing their attention on something else, moving them to a new environment, or otherwise engaging them in something other than what is distressing to them)  o Reassurance (reassure them that you are there to take care of them and that there is nothing they need to be worried about), and  o Reconsidering (consider the situation from their perspective and try to identify if there is something about the situation or environment that may be triggering their reaction).  Feedback: Alejandro Sherman will return for an interactive feedback session with this provider on 07/21/20 at which time his test performances, clinical impressions and treatment recommendations will be reviewed in detail. The patient understands he can contact our office should he require our assistance before this  time.  Thank you for your referral of Alejandro Sherman. Please feel free to contact me if you have any questions or concerns regarding this report.   This report is intended solely for the confidential review and use by the referring professional to assist in diagnostic and medical decision making needs.  This report should not be released to a third party without proper consent. [NOTE: data can be made available to qualified professionals with permission from the patient or legal representative/caregiver]    ___________________________ Horton Finer, PsyD Licensed Psychologist (Provisional)  Clinical Neuropsychologist

## 2020-07-21 NOTE — Progress Notes (Addendum)
Horton Finer, PsyD Licensed Psychologist (Provisional) Clinical Neuropsychologist [address], [address] [place], [place]  [address] Phone: [phone] Fax: [phone]   NEUROPSYCHOLOGICAL EVALUATION - CONFIDENTIAL  STOP! If you are not the patient, the patient's legal guardian/caregiver, or an individual for whom the patient has signed a release of information then do not proceed and contact the above mentioned provider. Thank you!NEUROPSYCHOLOGICAL EVALUATION   Name:    Alejandro Sherman  Date of Birth:   January 27, 1946 Date of Interview:  06/22/2020 Date of Testing:  07/13/2020   Date of Report:  07/17/2020 Date of Feedback  07/21/2020      Background Information:  Reason for Referral: Alejandro Sherman is a 74 y.o. male who is referred for neuropsychological evaluation by Nurse Practitioner Alejandro Sherman of [facility]  due to concerns about his memory and current level of cognitive functioning. The current evaluation consisted of a review of available medical records, an interview with the patient and wife, and the completion of a neuropsychological testing battery. Informed consent was obtained.  History of Presenting Problem:  Alejandro Sherman was interviewed separately from his wife, who also served as an informant. He described increased forgetfulness and mood changes such as irritability "to the point of being critical" within the past year. He admitted to feeling somewhat more socially withdrawn and that he tends to isolate more to avoid conflict with wife or others. He states that his short-term memory and attention/concentration are "getting worse as time goes on," and is somewhat concerned about these cognitive changes.  He admitted to problems with remembering names and recent conversations, misplacing things around the house and forgetting the purpose for going into a room on an almost daily basis. He denied problems with long term memory,  recognizing faces, navigation, getting lost, driving, following directions, organization, judgment, managing finances, etc.    He explains that he and wife share responsibility for paying the bills and denied any trouble with IADLS or basic tasks. He is sleeps 7-8 hours a night and denies any neurovegetative signs. He was diagnosed with OSA many years ago and reported being fully compliant with CPAP. He and his wife sleep in separate bedrooms due to the noise of his machine. He admitted to having nightmares on occasion, both related and unrelated to experiences in [place]. 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 He currently receives monthly disability for several service connected conditions including exposure to agent orange. He does have hearing aids but denied wearing them on a consistent basis, especially more recently when wearing a mask.  The patient's wife recalled first noticing personality and mood changes around 8 years ago and described gradual decline since. However, she reported an increase in anger within the past year and stated that he ruminates about news related to the Alejandro Sherman or [place] foreign policy, including recent events in [place]. She described him as a happy and positive person with generally stable mood prior to around 8 years ago with the exception of the first year or so after returning home from [place].   She identified long term memory as a relative strength compared to recalling more recent information such as conversations. Contrary to patient's report, his wife purportedly managed all finances; he has forgotten to pay bills in the past. She confirmed that he does still drive but stated that she often refused to sit as passenger due to road rage and excessive speeding. The patient is  taking care of his normal day-to-day activities including bathing, dressing and cooking. There are no reports of falls.    Work: Retired. 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   Psychiatric History: History of depression, anxiety, other MH disorder: Denied History of MH treatment: Denied History of SI: Denied History of substance dependence/treatment: Denied   Social History: Born/Raised: [place], [place] Education: B.A. in Business Administration Occupational history: 42 years in Therapist, nutritional. 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 Children:2 grown children along with 3 grandchildren  Alcohol: He denied history of abuse/dependance and admitted to currently drinking around 4-6 standard alcoholic beverages over the course of most weekends.  Tobacco: He quit smoking cigarettes in December 1996. SA: Denied   Medical History:  Past Medical History:  Diagnosis Date  . Bell's palsy    20 years ago, affecting the left facial nerve  . CAD (coronary artery disease)   . Edema   . Essential hypertension, benign   . Hyperlipidemia   . Myocardial infarction (HCC) 1996  . Obesity   . OSA (obstructive sleep apnea)    on CPAP,  12-19-05  . Ulcerative colitis (HCC)    Current medications:  Outpatient Encounter Medications as of 07/17/2020  Medication Sig  . aspirin 81 MG tablet Take 81 mg by mouth daily.  Marland Kitchen atorvastatin (LIPITOR) 80 MG tablet Take 80 mg by mouth daily.  . chlorthalidone (HYGROTON) 25 MG tablet TAKE 1 TABLET (25 MG TOTAL) BY MOUTH DAILY.  Marland Kitchen donepezil (ARICEPT) 5 MG tablet Take 1 tablet (5 mg total) by mouth at bedtime.  Marland Kitchen ezetimibe (ZETIA) 10 MG tablet Take 10 mg by mouth daily.  . fluticasone (FLONASE) 50 MCG/ACT nasal spray Place 2 sprays into both nostrils daily.  Marland Kitchen loratadine (CLARITIN) 10 MG tablet Take 10 mg by mouth.  . mesalamine (LIALDA) 1.2 G EC tablet Take 1.2 g by mouth daily with breakfast.  . metoprolol succinate (TOPROL-XL) 100 MG 24 hr tablet Take 100 mg by mouth.  . potassium  chloride SA (KLOR-CON) 20 MEQ tablet Take 20 mEq by mouth 2 (two) times daily.   No facility-administered encounter medications on file as of 07/17/2020.   Current Examination:  Behavioral Observations: Alejandro Sherman completed 120 minutes of neuropsychological testing with this provider. The patient did not appear overtly distressed by the testing session, per behavioral observation or via self-report. Rest breaks were offered. He gave good effort and persisted well overall.   Orientation: Oriented to all spheres. Accurately named the current President and his predecessor.  Tests Administered:    Scientist, research (life sciences) (ANT1)   [place] Naming Test (BNT)  [place] Verbal Learning Test, 3rd Edition (CVLT-3), Standard Form   Controlled Oral Word Association Test (COWAT)  Rey Complex Figure Test (RCFT),   Trail Making Test (TMT)  Wechsler Adult Intelligence Scale, 4th Edition (WAIS-IV), Select subtests    Test Results: Note: Standardized scores are presented only for use by appropriately trained professionals and to allow for any future test-retest comparison. These scores should not be interpreted without consideration of all the information that is contained in the rest of the report. The most recent standardization samples from the test publisher or other sources were used whenever possible to derive standard scores; scores were corrected for age, gender, ethnicity and education when available.   TEST SCORES:  Note: This summary of test scores accompanies  the interpretive report and should not be considered in isolation without reference to the appropriate sections in the text. Descriptors are based on appropriate normative data and may be adjusted based on clinical judgment. The terms "impaired" and "within normal limits (WNL)" are used when a more specific level of functioning cannot be determined.    Validity Testing:     Descriptor  WAIS-IV Reliable Digit Span: --- --- Within  Expectation  CVLT-III Forced Choice Recognition: --- --- Within Expectation  RCFT Combination Effort Score: --- --- Within Expectation         Intellectual Functioning:        Wechsler Adult Intelligence Scale (WAIS-IV):  Standard Score/ Scaled Score Percentile    Verbal Comprehension Index: 122 93 Superior  Similarities  12 75 Above Average  Information  16 98 Exceptionally High  Perceptual Reasoning Index:  92 30 Average  Matrix Reasoning  9 37 Average  Visual Puzzles 8 25 Average  Working Memory Index: 92 30 Average  Digit Span 10 50 Average  Arithmetic  7 16 Below Average  Processing Speed Index: 102 55 Average  Symbol Search  11 63 Average  Coding 10 50 Average         Wechsler Adult Intelligence Scale (WAIS-IV) Short Form*: Standard Score/ Scaled Score Percentile    Full Scale IQ  108 70 Average  Similarities 12 75 Above Average  Information  16 98 Exceptionally High  Visual Puzzles 8 25 Average  Digit Span 10 50 Average  Coding 10 50 Average  *From [employer] (2009)             Memory:               [place] Verbal Learning Test (CVLT-III), Standard Form: Raw Score (Scaled/Standard Score) Percentile    Total Trials 1-5 50/80 (113) 81 Above Average  List B 6/16 (13) 84 Above Average  Short-Delay Free Recall 11/16 (13) 84 Above Average  Short-Delay Cued Recall 14/16 (14) 91 Above Average  Long Delay Free Recall 13/16 (14) 91 Above Average  Long Delay Cued Recall 14/16 (14) 91 Above Average  Discriminability 3.6 (14) 91 Above Average  Recognition Hits 15/16 (12) 75 Above Average  False Positive Errors 0 (14) 91 Above Average         Rey-Osterrieth Complex Figure Test (RCFT): Raw Score (T Score) Percentile    Immediate Recall 15.5/36 (55) 69 Average  Delayed Recall 13/36 (49) 46 Average  Recognition Total Correct 21/24 (55) 69 Average  True Positives 9 >16 Within Normal Limits  False Positives 0 >16 Within Normal Limits         Attention/Executive  Function:               Trail Making Test (TMT): Raw Score (T Score) Percentile    Part A 44 secs.,  errors (43) 25 Average  Part B 162 secs.,  errors (33) 5 Well Below Average    Scaled Score Percentile    WAIS-IV Coding: 10 50 Average           Scaled Score Percentile    WAIS-IV Digit Span: 10 50 Average  Forward 12 75 Above Average  Backward 9 37 Average  Sequencing 9 37 Average           Scaled Score Percentile    WAIS-IV Similarities: 12 75 Above Average         Language:        Verbal Fluency Test: Raw Score (  T Score) Percentile    Phonemic Fluency (FAS) 27 (38) 12 Below Average  Animal Fluency 18 (47) 38 Average    Raw Score (T Score) Percentile    Boston Naming Test (BNT): 59/60 (66) 95 Well Above Average         Visuospatial/Visuoconstruction:          Raw Score Percentile    RCFT, Copy: 36/36 >16 Within Normal Limits           Scaled Score Percentile    WAIS-IV Matrix Reasoning: 9 37 Average  WAIS-IV Visual Puzzles: 8 25 Average          Description of Test Results: 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 Psychomotor processing speed was average. Auditory attention and working memory were average. Visual-spatial construction was average. Language abilities were intact. Specifically, confrontation naming was superior and semantic verbal fluency was average With regard to verbal memory, encoding and acquisition of non-contextual information (i.e., word list) was above average. After a brief distracter task, free recall remained above average. After a delay, free recall was above average. Cued recall was above average. Performance on a yes/no recognition task was average. With regard to non-verbal memory, delayed free recall of visual information was average. Executive functioning was relatively weak overall. Mental flexibility and set-shifting were mildly impaired on Trails B.  Verbal fluency with phonemic search restrictions was below average.  Verbal abstract reasoning was above average. Non-verbal abstract reasoning was average.   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 However, there are a few areas of mild impairment suggestive of mild fronto-subcortical dysfunction (e.g., mildly impaired phonemic verbal fluency, and aspects of executive functioning involving cognitive flexibility and set shifting). 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 Based on his medical history including multiple vascular risk factors (e.g., CAD, history of MI, essential hypertension, sleep apnea, etc.) and performance on testing, a vascular etiology is suspected.   Recommendations: Based on the findings of the present evaluation, the following recommendations are offered:  1. The patient is encouraged to attend to lifestyle factors for brain health (e.g., regular physical exercise, good nutrition habits, regular participation in cognitively-stimulating activities, and general stress management techniques), which are likely to have benefits for both emotional adjustment and cognition.  In fact, in addition to promoting general good health, regular exercise incorporating aerobic activities (e.g., brisk walking, jogging, bicycling, etc.) has been demonstrated to be a very effective treatment for depression and stress, with similar efficacy rates to both antidepressant medication and psychotherapy. And for those with orthopedic issues, water aerobics may be particularly beneficial.  2. Optimal control of  vascular risk factors is necessary to reduce the risk of further cognitive decline.   3. Included with the report is a flyer that includes activities that have therapeutic value and might be useful to keep you cognitively stimulated; these lists were compiled by therapists at the [facility], and are categorized by level of difficulty; this is not a substitute for therapy. This information can also be found at the following:  https://[email or web address].org/get-to-know-barrow/centers-programs/neurorehabilitation-center/neuro-rehab-apps-and-games/  4. Nutritional factors can have a significant effect on psychological and emotional  status, as well as overall brain functioning.  The following general recommendations have been associated with improvements in depression and other psychological symptoms, as well as lower risk for dementia and other forms of cognitive impairment.  Please discuss these recommendations with your physician and/or dietitian before initiating:  . Consume a wide variety of fresh fruits and vegetables, particularly including brightly colored items such as berries, oranges, tomatoes, peppers, carrots, broccoli, spinach, dark green lettuces, sweet potatoes, etc., all of which are high in vitamins and antioxidants.  . Consume foods that are high in fiber, such as legumes (e.g., beans, peas, lentils) and foods made from whole grains (e.g., whole wheat bread and pasta)  . Consume a significant amount of omega-3 essential fats and oils.  These can be found in natural food sources such as salmon and other fatty fish, and also products made from flax seed and flax seed oil.  Alternately, dietary supplementation with fish oil capsules and flax seed oil capsules is a good way to boost one's level of omega-3 consumption.  It is important to check with your doctor before taking these supplements, especially if you take blood thinning medication.  . If you do not already do so,  consider taking a quality multivitamin supplement under the guidance of your primary care physician.   . Consider keeping consumption of the following foods to a minimum: 1) foods made from white flour and white sugar; 2) artificial sweeteners; 3) deep-fried foods; 4) animal fat other than fish; 5) any foods containing "hydrogenated" or "trans" fats; 6) most other types of highly processed packaged/prepared foods.   5. Neuropsychological re-evaluation is recommended in approximately one year (or sooner) to assist with the management of the patient,  determine any clinical and functional significance of brain abnormality over time, as well as to document any potential improvement or decline in cognitive functioning. Lastly, any follow-up testing will help delineate the specific cognitive basis of any new functional complaints.   The following are several strategies that may help:  . Performance will generally be best in a structured, routine, and familiar environment, as opposed to situations involving complex problems.  Alejandro Sherman. Designate a place to keep your keys, wallet, cell phone, and other personal belongings. . Take time to register and process information to be remembered. Deeper encoding of information can be gained by forming a mental picture, making meaningful associations, connecting new information to previously learned and related information, paraphrasing and repetition.  . To the extent possible, multitasking should be avoided; break down tasks into smaller steps to help get started and to keep from feeling overwhelmed. And if there are difficulties in organization and planning, maintaining a daily organizer to help keep track of important appointments and information may be beneficial.   . Memory problems may at least be minimally addressed using compensatory strategies such as the use of a daily schedule to follow, memos, portable recorder, a centrally located bulletin board, or memory notebook.  A large calendar, placed in a highly visible location would be valuable to keep track of dates and appointments.  In addition, it would be helpful to keep a log of all of medical appointments with the name of the doctor, date of visit, diagnoses, and treatments.  . Use of a medication box is recommended to ensure compliance and decrease confusion regarding medication dosages, times, and dates. . To aid in managing problems with attention, the patient may consider using some of the following strategies: o The patient should simplify tasks.  There may be a need to break overly complex activities into simple step-by-step tasks, keep these steps written down in a note book and then check them off as they are completed which will help to stay on task and make sure the whole task is finished.  o The patient should set deadlines for everything, even for seemingly small tasks, prioritize time-sensitive tasks and write down every assignment, message, or important thought. o The patient is encouraged to use timers and alarms to stay on track and take breaks at regular intervals. Avoid piles of paperwork or procrastination by dealing with each item as it comes in.  In general, the best way to manage the behavioral and psychological symptoms of dementia such as agitation involves behavioral strategies such as using the three R's.  o Redirection (help distract your loved one by focusing their attention on something else, moving them to a new environment, or otherwise engaging them in something other than what is distressing to them)  o Reassurance (reassure them that you are there to take care of them and that there is nothing they need to be worried about), and  o Reconsidering (consider the situation from their perspective and try to identify if there is something about the situation or environment that may be triggering their reaction).  Feedback: Alejandro Sherman will return for an interactive feedback session  with this provider on 07/21/20 at which time his test performances, clinical impressions and treatment recommendations will be reviewed in detail. The patient understands he can contact our office should he require our assistance before this time.  Thank you for your referral of Alejandro Sherman. Please feel free to contact me if you have any questions or concerns regarding this report.   This report is intended solely for the confidential review and use by the referring professional to assist in diagnostic and medical decision making needs.  This report should not be released to a third party without proper consent. [NOTE: data can be made available to qualified professionals with permission from the patient or legal representative/caregiver]    ___________________________ Horton Finer, PsyD Licensed Psychologist (Provisional)  Clinical Neuropsychologist

## 2020-12-13 ENCOUNTER — Other Ambulatory Visit: Payer: Self-pay

## 2020-12-13 ENCOUNTER — Ambulatory Visit: Payer: Medicare Other | Admitting: Adult Health

## 2020-12-13 ENCOUNTER — Encounter: Payer: Self-pay | Admitting: Adult Health

## 2020-12-13 VITALS — BP 132/69 | HR 64 | Ht 69.0 in | Wt 197.0 lb

## 2020-12-13 DIAGNOSIS — G4733 Obstructive sleep apnea (adult) (pediatric): Secondary | ICD-10-CM | POA: Diagnosis not present

## 2020-12-13 DIAGNOSIS — Z9989 Dependence on other enabling machines and devices: Secondary | ICD-10-CM

## 2020-12-13 DIAGNOSIS — F0150 Vascular dementia without behavioral disturbance: Secondary | ICD-10-CM | POA: Diagnosis not present

## 2020-12-13 DIAGNOSIS — F01A0 Vascular dementia, mild, without behavioral disturbance, psychotic disturbance, mood disturbance, and anxiety: Secondary | ICD-10-CM

## 2020-12-13 NOTE — Patient Instructions (Signed)
Continue using CPAP nightly and greater than 4 hours each night °If your symptoms worsen or you develop new symptoms please let us know.  ° °

## 2020-12-13 NOTE — Progress Notes (Signed)
PATIENT: Alejandro Sherman DOB: 07/05/1946  REASON FOR VISIT: follow up HISTORY FROM: patient  HISTORY OF PRESENT ILLNESS: Today 12/13/20 :  Alejandro Sherman is a 75 year old male with a history of obstructive sleep apnea on CPAP and memory disturbance.  He returns today for follow-up.  He was sent for neuropsychological testing for memory disturbance.  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  [name] discussed recommendations with the patient.  The patient reports that he stopped Aricept.  He feels that his symptoms have remained stable.  He has a follow-up with [name].  He reports that he uses his CPAP nightly.  Denies any new symptoms.  Returns today for follow-up.  Compliance Report Compliance Payor Standard Usage 01/15/2020 - 02/13/2020 Usage days 30/30 days (100%) >= 4 hours 30 days (100%) Average usage (days used) 6 hours 33 minutes   AirSense 10 AutoSet Serial number [id number] Mode AutoSet Min Pressure 5 cmH2O Max Pressure 13 cmH2O EPR Fulltime EPR level 1  Therapy Pressure - cmH2O Median: 7.7 95th percentile: 10.2 Maximum: 11.2 Leaks - L/min Median: 6.0 95th percentile: 22.8 Maximum: 32.4 Events per hour AI: 1.1 HI: 0.3 AHI: 1.4 Apnea Index Central: 0.7 Obstructive: 0.3 Unknown: 0.0  06/12/20: Alejandro Sherman is a 75 year old male with a history of obstructive sleep apnea on CPAP and memory disturbance.  He returns today to discuss his memory.  He reports that when he initially started Aricept he noticed a change in his memory and attentiveness.  He also reports that his wife noticed this as well.  He states that since then these benefits have leveled off.  He has noticed some changes in his mood and behavior especially towards his wife.  He also notices some forgetfulness and trouble with his attention.  He returns today for an evaluation.  HISTORY 12/13/19:  Alejandro Sherman is a 75 year old male with a history of obstructive sleep apnea on  CPAP and memory disturbance.  He returns today for follow-up.  His download indicates that he use his machine 30 out of 30 days for compliance of 100%.  He uses machine greater than 4 hours each night.  On average he uses his machine 6 hours and 56 minutes.  His residual AHI is two on 5 to 13 cm of water with EPR of one.  His leak in the 95th percentile is 24.6 L/min.  He reports that the CPAP continues to work well for him.  He continues to notice some changes with his memory.  Reports that he can walk into a room and forget what he went in for.  He does state eventually it will come to him.  He does have hearing aids but reports that he does not wear them consistently now that we have to wear a mask.  He is able to complete all ADLs independently.  He manages his own finances.  Denies any trouble cooking.  He is currently retired and lives at home with his wife.  REVIEW OF SYSTEMS: Out of a complete 14 system review of symptoms, the patient complains only of the following symptoms, and all other reviewed systems are negative.  ESS 5 FSS21  ALLERGIES: Allergies  Allergen Reactions  . [place] Grass Extract Other (See Comments)    Sneezing, itchy watery eyes    HOME MEDICATIONS: Outpatient Medications Prior to Visit  Medication Sig Dispense Refill  . aspirin 81 MG tablet Take 81 mg by mouth daily.    Marland Kitchen atorvastatin (LIPITOR)  80 MG tablet Take 80 mg by mouth daily.    . chlorthalidone (HYGROTON) 25 MG tablet TAKE 1 TABLET (25 MG TOTAL) BY MOUTH DAILY.  9  . donepezil (ARICEPT) 5 MG tablet Take 1 tablet (5 mg total) by mouth at bedtime. 90 tablet 1  . ezetimibe (ZETIA) 10 MG tablet Take 10 mg by mouth daily.    . fluticasone (FLONASE) 50 MCG/ACT nasal spray Place 2 sprays into both nostrils daily.    Marland Kitchen loratadine (CLARITIN) 10 MG tablet Take 10 mg by mouth.    . mesalamine (LIALDA) 1.2 G EC tablet Take 1.2 g by mouth daily with breakfast.    . metoprolol succinate (TOPROL-XL) 100 MG 24 hr  tablet Take 100 mg by mouth.    . potassium chloride SA (KLOR-CON) 20 MEQ tablet Take 20 mEq by mouth 2 (two) times daily.     No facility-administered medications prior to visit.    PAST MEDICAL HISTORY: Past Medical History:  Diagnosis Date  . Bell's palsy    20 years ago, affecting the left facial nerve  . CAD (coronary artery disease)   . Edema   . Essential hypertension, benign   . Hyperlipidemia   . Myocardial infarction (HCC) 1996  . Obesity   . OSA (obstructive sleep apnea)    on CPAP,  12-19-05  . Ulcerative colitis (HCC)     PAST SURGICAL HISTORY: Past Surgical History:  Procedure Laterality Date  . Ulcerative colitis  04/2010   Hospitalization    FAMILY HISTORY: Family History  Problem Relation Age of Onset  . Kidney cancer Mother   . Heart attack Father   . Ovarian cancer Sister     SOCIAL HISTORY: Social History   Socioeconomic History  . Marital status: Married    Spouse name: [name]  . Number of children: 2  . Years of education: 4  . Highest education level: Not on file  Occupational History  . Not on file  Tobacco Use  . Smoking status: Former Games developer  . Smokeless tobacco: Never Used  Substance and Sexual Activity  . Alcohol use: Yes    Alcohol/week: 0.0 standard drinks    Comment: 2 glasses of wine- nightly  . Drug use: No  . Sexual activity: Not on file  Other Topics Concern  . Not on file  Social History Narrative   Patient is married. [name])   Patient has two adult children.   Patient is retired.   Patient has a college education.   Patient is right-handed.   Patient does not drink any caffeine.      Social Determinants of Health   Financial Resource Strain: Not on file  Food Insecurity: Not on file  Transportation Needs: Not on file  Physical Activity: Not on file  Stress: Not on file  Social Connections: Not on file  Intimate Partner Violence: Not on file      PHYSICAL EXAM  There were no vitals filed for this  visit. There is no height or weight on file to calculate BMI.   MMSE - Mini Mental State Exam 06/12/2020 12/13/2019 12/16/2017  Orientation to time 5 5 5   Orientation to Place 5 5 4   Registration 3 3 3   Attention/ Calculation 5 4 5   Recall 3 3 3   Language- name 2 objects 2 2 2   Language- repeat 1 1 1   Language- follow 3 step command 3 1 3   Language- read & follow direction 1 1 1   Write a  sentence 1 1 1   Copy design 0 1 1  Copy design-comments - named 15 animals -  Total score 29 27 29      Generalized: Well developed, in no acute distress  Chest: Lungs clear to auscultation bilaterally  Neurological examination  Mentation: Alert oriented to time, place, history taking. Follows all commands speech and language fluent Cranial nerve II-XII: Extraocular movements were full, visual field were full on confrontational test Head turning and shoulder shrug  were normal and symmetric. Motor: The motor testing reveals 5 over 5 strength of all 4 extremities. Good symmetric motor tone is noted throughout.  Sensory: Sensory testing is intact to soft touch on all 4 extremities. No evidence of extinction is noted.  Gait and station: Gait is normal.    DIAGNOSTIC DATA (LABS, IMAGING, TESTING) - I reviewed patient records, labs, notes, testing and imaging myself where available.   Lab Results  Component Value Date   VITAMINB12 413 12/13/2019   Lab Results  Component Value Date   TSH 2.390 12/13/2019      ASSESSMENT AND PLAN 75 y.o. year old male  has a past medical history of Bell's palsy, CAD (coronary artery disease), Edema, Essential hypertension, benign, Hyperlipidemia, Myocardial infarction (HCC) (1996), Obesity, OSA (obstructive sleep apnea), and Ulcerative colitis (HCC). 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 12/15/2019  Obstructive sleep apnea on CPAP   CPAP download shows  good compliance  Good treatment of apnea  Encouraged to use CPAP nightly and greater than 4 hours each night  Follow-up in 1 year or sooner if needed  I spent 30  minutes of face-to-face and non-face-to-face time with patient.  This included previsit chart review, lab review, study review, order entry, electronic health record documentation, patient education.  61, MSN, NP-C 12/13/2020, 1:42 PM [facility] [address] [place], [address] [place] [phone]

## 2021-04-20 ENCOUNTER — Ambulatory Visit: Payer: Medicare Other | Admitting: Psychology

## 2021-04-23 ENCOUNTER — Ambulatory Visit: Payer: Medicare Other | Admitting: Psychology

## 2021-05-23 ENCOUNTER — Other Ambulatory Visit: Payer: Self-pay

## 2021-05-23 ENCOUNTER — Encounter: Payer: Self-pay | Admitting: Psychology

## 2021-05-23 ENCOUNTER — Encounter: Payer: Medicare Other | Attending: Psychology | Admitting: Psychology

## 2021-05-23 DIAGNOSIS — F0150 Vascular dementia without behavioral disturbance: Secondary | ICD-10-CM | POA: Diagnosis present

## 2021-05-23 DIAGNOSIS — G4733 Obstructive sleep apnea (adult) (pediatric): Secondary | ICD-10-CM | POA: Diagnosis present

## 2021-05-23 DIAGNOSIS — Z9989 Dependence on other enabling machines and devices: Secondary | ICD-10-CM | POA: Diagnosis present

## 2021-05-23 DIAGNOSIS — F01A0 Vascular dementia, mild, without behavioral disturbance, psychotic disturbance, mood disturbance, and anxiety: Secondary | ICD-10-CM

## 2021-05-23 NOTE — Progress Notes (Signed)
05/23/2021: 1 PM-2 PM  Today's visit was an in person visit that was conducted in my outpatient clinic office initially with the patient and myself present and then at the end speaking directly with the patient's wife.  We reviewed any current or recent changes over the past 9 months since the previous neuropsychological evaluation.  The purpose of this return visit is for repeat testing to assess for any progressive changes and to refine treatment recommendations.  During the initial clinical interview with just the patient myself, the patient reported that he feels encouraged with how things and going and that they have been "mellowed out" from where they have been with earlier evaluation.  The patient acknowledges that he still has some short-term memory loss but that if he takes time or is cued he will recall the information later.  The patient denies any progressive worsening of his symptoms.  He continues to be CPAP compliant.  The patient reports that he feels like his mood has been better and while he acknowledges that he has had times of agitation and temper this has not gotten any worse.  The patient reports that he and his wife decided that she would drive when they are together, which creates a less upsetting situation for both of them.  He reports that this has been better.  The patient denies any new medical issues that have been developing and that he and his wife have been trying to stay physically active and been engaged with a trainer 3 times a week for physical activity including strength training and aerobic activity and he feels like he is doing better.  In fact, they have gone down to [facility] to engage in dancing and other physical activities and they have gone well.  In my brief conversation directly with the patient's wife she painted a somewhat different pitcher.  The patient's wife reports that she sees some ongoing worsening of short-term memory issues and that the patient is  still getting agitated and having periods of emotional dyscontrol.  They have adjusted better to this and she acknowledged that the patient is not driving and she is which is helped those situations.  However, she reports that there continue to be some emotional and personality changes and that feels like it is still an issue and that there have been some worsening in cognitive functioning since his initial neuropsychological evaluation 9 months ago.  The plan going forward is to repeat the initial neuropsychological battery that was administered and interpreted in September of last year.  I have included the clinical impressions from that evaluation below for convenience and the full neuropsychological evaluation from the previous assessment can be found in his EMR dated 07/17/2020.  We will repeat the previous testing for direct comparisons.     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 However, there are a few areas of mild impairment suggestive of mild fronto-subcortical dysfunction (e.g., mildly impaired phonemic verbal fluency, and aspects of executive functioning involving cognitive flexibility and set shifting). 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 Based on his medical history including multiple vascular risk factors (e.g., CAD, history of MI, essential hypertension, sleep apnea, etc.) and performance on testing,  a vascular etiology is suspected.

## 2021-06-14 ENCOUNTER — Other Ambulatory Visit: Payer: Self-pay

## 2021-06-14 ENCOUNTER — Encounter: Payer: Medicare Other | Attending: Psychology

## 2021-06-14 DIAGNOSIS — Z9989 Dependence on other enabling machines and devices: Secondary | ICD-10-CM | POA: Diagnosis not present

## 2021-06-14 DIAGNOSIS — Z733 Stress, not elsewhere classified: Secondary | ICD-10-CM | POA: Insufficient documentation

## 2021-06-14 DIAGNOSIS — Z655 Exposure to disaster, war and other hostilities: Secondary | ICD-10-CM | POA: Diagnosis not present

## 2021-06-14 DIAGNOSIS — G4733 Obstructive sleep apnea (adult) (pediatric): Secondary | ICD-10-CM | POA: Insufficient documentation

## 2021-06-14 DIAGNOSIS — F01A0 Vascular dementia, mild, without behavioral disturbance, psychotic disturbance, mood disturbance, and anxiety: Secondary | ICD-10-CM

## 2021-06-14 DIAGNOSIS — Z9182 Personal history of military deployment: Secondary | ICD-10-CM | POA: Diagnosis not present

## 2021-06-14 DIAGNOSIS — F0150 Vascular dementia without behavioral disturbance: Secondary | ICD-10-CM | POA: Insufficient documentation

## 2021-06-14 NOTE — Progress Notes (Signed)
Behavioral Observation  The patient appeared well-groomed and appropriately dressed for the testing session. His manners were polite and appropriate to the situation. The patient reported that he was nervous but still demonstrated a positive attitude toward testing and gave good effort. The patient seemed to have some difficulty hearing throughout the test.   Neuropsychology Note  Alejandro Sherman completed 90 minutes of neuropsychological testing with technician, Marica Otter, BA, under the supervision of Arley Phenix, PsyD., Clinical Neuropsychologist. The patient did not appear overtly distressed by the testing session, per behavioral observation or via self-report to the technician. Rest breaks were offered.   Clinical Decision Making: In considering the patient's current level of functioning, level of presumed impairment, nature of symptoms, emotional and behavioral responses during clinical interview, level of literacy, and observed level of motivation/effort, a battery of tests was selected by [name] during initial consultation on 05/23/2021. This was communicated to the technician. Communication between the neuropsychologist and technician was ongoing throughout the testing session and changes were made as deemed necessary based on patient performance on testing, technician observations and additional pertinent factors such as those listed above.  Tests Administered: [employer], 3rd Edition (CVLT-3); Standard Form [employer] Test (BNT)  Controlled Oral Word Association Test (COWAT; FAS & Animals)  Trail Making Test (TMT; Part A & B) Wechsler Adult Intelligence Scale, 4th Edition (WAIS-IV)  Results:  COWAT Fas Total = 33 Z=-0.743 Animals = 15 Z=-0.8   [place] Naming Test Items correct without assistance = 60 Number of stimulus cues given = 0 Number of phonemic cues given = 0   Trail Making Test  Part A Time = 0:56.90 Errors: 0 Part  B Time = 2:16.27. Errors = 1    WAIS-IV  Composite Score Summary  Scale Sum of Scaled Scores Composite Score Percentile Rank 95% Conf. Interval Qualitative Description  Verbal Comprehension 48 VCI 136 99 129-140 Very Superior  Perceptual Reasoning 26 PRI 92 30 86-99 Average  Working Memory 19 WMI 97 42 90-104 Average  Processing Speed 22 PSI 105 63 96-113 Average  Full Scale 115 FSIQ 110 75 106-114 High Average  General Ability 74 GAI 114 82 109-119 High Average      Verbal Comprehension Subtests Summary  Subtest Raw Score Scaled Score Percentile Rank Reference Group Scaled Score SEM  Similarities 28 13 84 11 1.12  Vocabulary 54 16 98 17 0.73  Information 26 19 99.9 19 0.73  (Comprehension) 29 14 91 13 1.08       Perceptual Reasoning Subtests Summary  Subtest Raw Score Scaled Score Percentile Rank Reference Group Scaled Score SEM  Block Design 24 9 37 6 1.27  Matrix Reasoning 7 8 25 3  0.73  Visual Puzzles 9 9 37 6 0.99  (Picture Completion) 9 10 50 7 1.12       Working Raw Score Scaled Score Percentile Rank Reference Group Scaled Score SEM  Digit Span 22 9 37 7 0.79  Arithmetic 12 10 50 9 0.95       Processing Speed Subtests Summary  Subtest Raw Score Scaled Score Percentile Rank Reference Group Scaled Score SEM  Symbol Search 24 11 63 7 1.12  Coding 50 11 63 6 1.12      CVLT-3 Standard Form  Core Score Summary   Immediate Recall Score Raw score Scaled score Percentile rank    Trial 1 Correct 4 8 25     Trial 2 Correct 7 10  50    Trial 3 Correct 13 15 95    Trial 4 Correct 12 13 84    Trial 5 Correct 13 13 84    List B Correct 4 9 37         Delayed Recall Score Raw score Scaled score Percentile rank    Short Delay Free Recall Correct 11 13 84    Short Delay Cued Recall Correct 13 13 84    Long Delay Free Recall Correct 11 12 75    Long Delay Cued Recall Correct 12 12 75         Yes/No  Recognition Score Raw score Scaled score Percentile rank    Total Hits 16 14 91    Total False Positives 0 14 91    Recognition Discriminability (d') 4 16 98    Recognition Discriminability Nonparametric 100 16 98         Recall Errors Score Raw score Scaled score Percentile rank    Total Intrusions 4 10 50         Forced Choice Recognition Score Raw score Base rate    Total Hits 16 100.0       Standard Score Summary Index Sum of scaled scores Index score Percentile rank    Trials 1-5 Correct 59 110 75    Delayed Recall Correct 50 113 81    Total Recall Correct 118 111 77        Feedback to Patient: Alejandro Sherman will return on 07/10/2021 for an interactive feedback session with [name] at which time his test performances, clinical impressions and treatment recommendations will be reviewed in detail. The patient understands he can contact our office should he require our assistance before this time.  90 minutes spent face-to-face with patient administering standardized tests, 30 minutes spent scoring Radiographer, therapeutic). [CPT [id number], [id number]]  Full report to follow.

## 2021-06-19 ENCOUNTER — Other Ambulatory Visit: Payer: Self-pay

## 2021-06-19 ENCOUNTER — Encounter (HOSPITAL_BASED_OUTPATIENT_CLINIC_OR_DEPARTMENT_OTHER): Payer: Medicare Other | Admitting: Psychology

## 2021-06-19 DIAGNOSIS — F0150 Vascular dementia without behavioral disturbance: Secondary | ICD-10-CM

## 2021-06-19 DIAGNOSIS — G4733 Obstructive sleep apnea (adult) (pediatric): Secondary | ICD-10-CM

## 2021-06-19 DIAGNOSIS — Z9989 Dependence on other enabling machines and devices: Secondary | ICD-10-CM

## 2021-06-19 DIAGNOSIS — F01A0 Vascular dementia, mild, without behavioral disturbance, psychotic disturbance, mood disturbance, and anxiety: Secondary | ICD-10-CM

## 2021-07-03 ENCOUNTER — Ambulatory Visit: Payer: Medicare Other | Admitting: Psychology

## 2021-07-10 ENCOUNTER — Encounter: Payer: Self-pay | Admitting: Psychology

## 2021-07-10 ENCOUNTER — Other Ambulatory Visit: Payer: Self-pay

## 2021-07-10 ENCOUNTER — Encounter: Payer: Medicare Other | Attending: Psychology | Admitting: Psychology

## 2021-07-10 DIAGNOSIS — G4733 Obstructive sleep apnea (adult) (pediatric): Secondary | ICD-10-CM

## 2021-07-10 DIAGNOSIS — F0150 Vascular dementia without behavioral disturbance: Secondary | ICD-10-CM | POA: Diagnosis present

## 2021-07-10 DIAGNOSIS — Z9989 Dependence on other enabling machines and devices: Secondary | ICD-10-CM | POA: Diagnosis present

## 2021-07-10 DIAGNOSIS — F01A0 Vascular dementia, mild, without behavioral disturbance, psychotic disturbance, mood disturbance, and anxiety: Secondary | ICD-10-CM

## 2021-07-10 NOTE — Progress Notes (Signed)
Neuropsychological Evaluation   Patient:  Alejandro Sherman   DOB: [birth year]  MR Number: [id number]  Location: [facility] PHYSICAL MEDICINE AND REHABILITATION [address] [place], [address] [id number] [facility] [place] [address] Dept: [phone]  Start: 10 AM End: 11 AM  Provider/Observer:     Hershal Coria PsyD  Chief Complaint:      Chief Complaint  Patient presents with   Memory Loss   Agitation   Other    Reason For Service:     Clinical Interview and clinical data from 06/22/20 with [name]:  [name] was interviewed separately from his wife, who also served as an informant. He described increased forgetfulness and mood changes such as irritability "to the point of being critical" within the past year. He admitted to feeling somewhat more socially withdrawn and that he tends to isolate more to avoid conflict with wife or others. He states that his short-term memory and attention/concentration are "getting worse as time goes on," and is somewhat concerned about these cognitive changes.  He admitted to problems with remembering names and recent conversations, misplacing things around the house and forgetting the purpose for going into a room on an almost daily basis. He denied problems with long term memory, recognizing faces, navigation, getting lost, driving, following directions, organization, judgment, managing finances, etc.     He explains that he and wife share responsibility for paying the bills and denied any trouble with IADLS or basic tasks. He is sleeps 7-8 hours a night and denies any neurovegetative signs. He was diagnosed with OSA many years ago and reported being fully compliant with CPAP. He and his wife sleep in separate bedrooms due to the noise of his machine. He admitted to having nightmares on occasion, both related and unrelated to experiences in [place]. 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 He currently receives monthly disability for several service connected conditions including exposure to agent orange. He does have hearing aids but denied wearing them on a consistent basis, especially more recently when wearing a mask.   The patient's wife recalled first noticing personality and mood changes around 8 years ago and described gradual decline since. However, she reported an increase in anger within the past year and stated that he ruminates about news related to the [employer] or [place] foreign policy, including recent events in [place]. She described him as a happy and positive person with generally stable mood prior to around 8 years ago with the exception of the first year or so after returning home from [place].    She identified long term memory as a relative strength compared to recalling more recent information such as conversations. Contrary to patient's report, his wife purportedly managed all finances; he has forgotten to pay bills in the past. She confirmed that he does still drive but stated that she often refused to sit as passenger due to road rage and excessive speeding. The patient is taking care of his normal day-to-day activities including bathing, dressing and cooking.  There are no reports of falls.    Follow-up interview with [name]  05/23/2021:  During the initial clinical interview with just the patient and myself, the patient reported that he feels encouraged with how things and going and that they have been "mellowed out" from where they have been with earlier evaluation.  The patient acknowledges that he still has some short-term memory loss but that  if he takes time or is cued he will recall the information later.  The patient denies any progressive worsening of his symptoms.  He continues to be CPAP compliant.  The patient reports that he feels like his mood has been better and while he acknowledges that he has had times of agitation  and temper this has not gotten any worse.  The patient reports that he and his wife decided that she would drive when they are together, which creates a less upsetting situation for both of them.  He reports that this has been better.  The patient denies any new medical issues that have been developing and that he and his wife have been trying to stay physically active and been engaged with a trainer 3 times a week for physical activity including strength training and aerobic activity and he feels like he is doing better.  In fact, they have gone down to [facility] to engage in dancing and other physical activities and they have gone well.  In my brief conversation directly with the patient's wife she painted a somewhat different picture.  The patient's wife reports that she sees some ongoing worsening of short-term memory issues and that the patient is still getting agitated and having periods of emotional dyscontrol.  They have adjusted better to this and she acknowledged that the patient is not driving and she is which is helped those situations.  However, she reports that there continue to be some emotional and personality changes and that feels like it is still an issue and that there have been some worsening in cognitive functioning since his initial neuropsychological evaluation 9 months ago.  The plan going forward is to repeat the initial neuropsychological battery that was administered and interpreted in September of last year.  I have included the clinical impressions from that evaluation below for convenience and the full neuropsychological evaluation from the previous assessment can be found in his EMR dated 07/17/2020.  Tests Administered: [employer], 3rd Edition (CVLT-3); Standard Form [employer] Test (BNT)  Controlled Oral Word Association Test (COWAT; FAS & Animals)  Trail Making Test (TMT; Part A & B) Wechsler Adult Intelligence Scale, 4th Edition  (WAIS-IV)  Participation Level:   Active  Participation Quality:  Appropriate      Behavioral Observation:  The patient appeared well-groomed and appropriately dressed for the testing session. His manners were polite and appropriate to the situation. The patient reported that he was nervous but still demonstrated a positive attitude toward testing and gave good effort. The patient seemed to have some difficulty hearing throughout the test.   Well Groomed, Alert, and Appropriate.   Test Results:   Initial presentation of data is from the 2021 evaluation with [name] and the second data set is from the current evaluation.  2021:  Validity Testing:     Descriptor  WAIS-IV Reliable Digit Span: --- --- Within Expectation  CVLT-III Forced Choice Recognition: --- --- Within Expectation  RCFT Combination Effort Score: --- --- Within Expectation           Intellectual Functioning:        Wechsler Adult Intelligence Scale (WAIS-IV):  Standard Score/ Scaled Score Percentile    Verbal Comprehension Index: 122 93 Superior  Similarities  12 75 Above Average  Information  16 98 Exceptionally High  Perceptual Reasoning Index:  92 30 Average  Matrix Reasoning  9 37 Average  Visual Puzzles 8 25 Average  Working Memory Index: 92 30  Average  Digit Span 10 50 Average  Arithmetic  7 16 Below Average  Processing Speed Index: 102 55 Average  Symbol Search  11 63 Average  Coding 10 50 Average           Wechsler Adult Intelligence Scale (WAIS-IV) Short Form*: Standard Score/ Scaled Score Percentile    Full Scale IQ  108 70 Average  Similarities 12 75 Above Average  Information  16 98 Exceptionally High  Visual Puzzles 8 25 Average  Digit Span 10 50 Average  Coding 10 50 Average  *From [employer] (2009)                 Memory:                 [place] Verbal Learning Test (CVLT-III), Standard Form: Raw Score (Scaled/Standard Score) Percentile    Total Trials 1-5 50/80 (113) 81 Above  Average  List B 6/16 (13) 84 Above Average  Short-Delay Free Recall 11/16 (13) 84 Above Average  Short-Delay Cued Recall 14/16 (14) 91 Above Average  Long Delay Free Recall 13/16 (14) 91 Above Average  Long Delay Cued Recall 14/16 (14) 91 Above Average  Discriminability 3.6 (14) 91 Above Average  Recognition Hits 15/16 (12) 75 Above Average  False Positive Errors 0 (14) 91 Above Average           Rey-Osterrieth Complex Figure Test (RCFT): Raw Score (T Score) Percentile    Immediate Recall 15.5/36 (55) 69 Average  Delayed Recall 13/36 (49) 46 Average  Recognition Total Correct 21/24 (55) 69 Average  True Positives 9 >16 Within Normal Limits  False Positives 0 >16 Within Normal Limits           Attention/Executive Function:                 Trail Making Test (TMT): Raw Score (T Score) Percentile    Part A 44 secs.,  errors (43) 25 Average  Part B 162 secs.,  errors (33) 5 Well Below Average    Scaled Score Percentile    WAIS-IV Coding: 10 50 Average             Scaled Score Percentile    WAIS-IV Digit Span: 10 50 Average  Forward 12 75 Above Average  Backward 9 37 Average  Sequencing 9 37 Average             Scaled Score Percentile    WAIS-IV Similarities: 12 75 Above Average           Language:        Verbal Fluency Test: Raw Score (T Score) Percentile    Phonemic Fluency (FAS) 27 (38) 12 Below Average  Animal Fluency 18 (47) 38 Average    Raw Score (T Score) Percentile    [employer] Naming Test (BNT): 59/60 (66) 95 Well Above Average           Visuospatial/Visuoconstruction:          Raw Score Percentile    RCFT, Copy: 36/36 >16 Within Normal Limits             Scaled Score Percentile    WAIS-IV Matrix Reasoning: 9 37 Average  WAIS-IV Visual Puzzles: 8 25 Average              2022:  COWAT Fas Total = 33 Z=-0.743 Animals = 15 Z=-0.8     [place] Naming Test Items correct without assistance = 60 Number of stimulus cues given =  0 Number of phonemic cues given =  0     Trail Making Test  Part A Time = 0:56.90 Errors: 0 Part B Time = 2:16.27. Errors = 1       WAIS-IV             Composite Score Summary        Scale Sum of Scaled Scores Composite Score Percentile Rank 95% Conf. Interval Qualitative Description  Verbal Comprehension 48 VCI 136 99 129-140 Very Superior  Perceptual Reasoning 26 PRI 92 30 86-99 Average  Working Memory 19 WMI 97 42 90-104 Average  Processing Speed 22 PSI 105 63 96-113 Average  Full Scale 115 FSIQ 110 75 106-114 High Average  General Ability 74 GAI 114 82 109-119 High Average                  Verbal Comprehension Subtests Summary    Subtest Raw Score Scaled Score Percentile Rank Reference Group Scaled Score SEM  Similarities 28 13 84 11 1.12  Vocabulary 54 16 98 17 0.73  Information 26 19 99.9 19 0.73  (Comprehension) 29 14 91 13 1.08                    Perceptual Reasoning Subtests Summary     Subtest Raw Score Scaled Score Percentile Rank Reference Group Scaled Score SEM  Block Design 24 9 37 6 1.27  Matrix Reasoning 7 8 25 3  0.73  Visual Puzzles 9 9 37 6 0.99  (Picture Completion) 9 10 50 7 1.12                    Working Raw Score Scaled Score Percentile Rank Reference Group Scaled Score SEM  Digit Span 22 9 37 7 0.79  Arithmetic 12 10 50 9 0.95                    Processing Speed Subtests Summary      Subtest Raw Score Scaled Score Percentile Rank Reference Group Scaled Score SEM  Symbol Search 24 11 63 7 1.12  Coding 50 11 63 6 1.12          CVLT-3 Standard Form   Core Score Summary    Immediate Recall Score Raw score Scaled score Percentile rank    Trial 1 Correct 4 8 25     Trial 2 Correct 7 10 50    Trial 3 Correct 13 15 95    Trial 4 Correct 12 13 84    Trial 5 Correct 13 13 84    List B Correct 4 9 37            Delayed Recall Score Raw score Scaled score Percentile rank    Short Delay Free Recall Correct  11 13 84    Short Delay Cued Recall Correct 13 13 84    Long Delay Free Recall Correct 11 12 75    Long Delay Cued Recall Correct 12 12 75            Yes/No Recognition Score Raw score Scaled score Percentile rank    Total Hits 16 14 91    Total False Positives 0 14 91    Recognition Discriminability (d') 4 16 98    Recognition Discriminability Nonparametric 100 16 98            Recall Errors Score Raw score Scaled score Percentile rank  Total Intrusions 4 10 50            Forced Choice Recognition Score Raw score Base rate    Total Hits 16 100.0          Standard Score Summary Index Sum of scaled scores Index score Percentile rank    Trials 1-5 Correct 59 110 75    Delayed Recall Correct 50 113 81    Total Recall Correct 118 111 77         Impression/Diagnosis:   Making direct comparisons between the initial 2021 evaluation and the current neuropsychological evaluation there was consistent patterns of performance.  There was no indication of progressive decline over the past year and the patient continued to do quite well on measures of verbal comprehension and information processing speed measures.  Memory functions were also within normal limits or in fact in the high average range relative to a normative population.  The patient did show some ongoing consistent weaknesses with regard to executive functioning including mental flexibility and set shifting as well as issues with some focus execute abilities.  However, there was no decrease in function and in fact in some areas he actually performed better than he did the year previously.  There were a few continued areas of mild impairments suggestive of mild frontal subcortical dysfunction including ongoing issues with mildly impaired phonetic verbal fluency and aspects of executive functioning including cognitive flexibility and difficulties with shifting attention and set shifting.  There were no  indications of any type of progressive loss or patterns consistent with a diagnosis of dementia.  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 in his 07/17/2020 report focusing on lifestyle factors and vascular risk factors to be continued to be addressed.  It may also be beneficial for the patient to work on behavioral and psychological symptoms around agitation and frustration that worked on with [name] previously.  In any event, there were no indications of progression or worsening of his symptoms and they are very focally related to mild fronto subcortical weaknesses.  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 Clinical Neuropsychologist

## 2021-07-10 NOTE — Progress Notes (Signed)
07/10/2021: 1 PM-2 PM today's visit was an in person visit was conducted in my outpatient clinic office.  The patient, myself and his wife were all present for this feedback session.  Today I provided feedback regarding the results of the recent neuropsychological evaluation.  The patient is shown no progressive decline over the past year and his cognitive functioning.  While there does continue to be some mild deficits with regard to some aspects of verbal fluency and some aspects of executive functioning including cognitive flexibility and shifting attention along with his behavioral changes related to agitation none of these deficits have worsened and other aspects of cognitive function have remained stable.  This pattern is not consistent with progressive dementia but would be more consistent with some vascular changes that are likely age related primarily.  Recommendations for continued vigilance for metabolic health were reiterated including healthy dietary patterns, regular sustained physical activity and good sleep patterns.  The patient tends to stay up later in the night and describes himself as a night owl and I suggested trying to shift his sleep around to getting up earlier in the morning and to bed earlier at night and attempting to be outside for 15 minutes during each sunrise and Sunset to help regulate better sleep hygiene and sleep-wake cycles.  Below of included the impression/diagnosis from the formal neuropsychological evaluation that can be found in its entirety in the patient's EMR dated 06/19/2021.    Impression/Diagnosis:                     Making direct comparisons between the initial 2021 evaluation and the current neuropsychological evaluation there was consistent patterns of performance.  There was no indication of progressive decline over the past year and the patient continued to do quite well on measures of verbal comprehension and information processing speed measures.  Memory  functions were also within normal limits or in fact in the high average range relative to a normative population.  The patient did show some ongoing consistent weaknesses with regard to executive functioning including mental flexibility and set shifting as well as issues with some focus execute abilities.  However, there was no decrease in function and in fact in some areas he actually performed better than he did the year previously.  There were a few continued areas of mild impairments suggestive of mild frontal subcortical dysfunction including ongoing issues with mildly impaired phonetic verbal fluency and aspects of executive functioning including cognitive flexibility and difficulties with shifting attention and set shifting.  There were no indications of any type of progressive loss or patterns consistent with a diagnosis of dementia.  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 in his 07/17/2020 report focusing on lifestyle factors and vascular risk factors to be continued to be addressed.  It may also be beneficial for the patient to work on behavioral and psychological symptoms around agitation and frustration that worked on with [name] previously.  In any event, there were no indications of progression or worsening of his symptoms and they are very focally related to mild fronto subcortical weaknesses.   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 Clinical Neuropsychologist

## 2021-10-24 ENCOUNTER — Ambulatory Visit: Payer: Medicare Other | Admitting: Psychology

## 2021-11-01 ENCOUNTER — Ambulatory Visit: Payer: Medicare Other | Admitting: Psychology

## 2021-11-18 ENCOUNTER — Emergency Department (HOSPITAL_COMMUNITY): Payer: Medicare Other

## 2021-11-18 ENCOUNTER — Other Ambulatory Visit: Payer: Self-pay

## 2021-11-18 ENCOUNTER — Encounter (HOSPITAL_COMMUNITY): Payer: Self-pay | Admitting: Neurology

## 2021-11-18 ENCOUNTER — Inpatient Hospital Stay (HOSPITAL_COMMUNITY)
Admission: EM | Admit: 2021-11-18 | Discharge: 2021-11-19 | DRG: 063 | Disposition: A | Discharge status: Home / Self Care | Payer: Medicare Other | Attending: Neurology | Admitting: Neurology

## 2021-11-18 DIAGNOSIS — Z79899 Other long term (current) drug therapy: Secondary | ICD-10-CM | POA: Diagnosis not present

## 2021-11-18 DIAGNOSIS — Z8669 Personal history of other diseases of the nervous system and sense organs: Secondary | ICD-10-CM | POA: Diagnosis not present

## 2021-11-18 DIAGNOSIS — Z7982 Long term (current) use of aspirin: Secondary | ICD-10-CM

## 2021-11-18 DIAGNOSIS — I639 Cerebral infarction, unspecified: Secondary | ICD-10-CM | POA: Diagnosis present

## 2021-11-18 DIAGNOSIS — R29702 NIHSS score 2: Secondary | ICD-10-CM | POA: Diagnosis present

## 2021-11-18 DIAGNOSIS — I10 Essential (primary) hypertension: Secondary | ICD-10-CM | POA: Diagnosis present

## 2021-11-18 DIAGNOSIS — E785 Hyperlipidemia, unspecified: Secondary | ICD-10-CM | POA: Diagnosis present

## 2021-11-18 DIAGNOSIS — R4781 Slurred speech: Secondary | ICD-10-CM | POA: Diagnosis present

## 2021-11-18 DIAGNOSIS — E119 Type 2 diabetes mellitus without complications: Secondary | ICD-10-CM | POA: Diagnosis present

## 2021-11-18 DIAGNOSIS — I2510 Atherosclerotic heart disease of native coronary artery without angina pectoris: Secondary | ICD-10-CM | POA: Diagnosis present

## 2021-11-18 DIAGNOSIS — G510 Bell's palsy: Secondary | ICD-10-CM | POA: Diagnosis present

## 2021-11-18 DIAGNOSIS — Z951 Presence of aortocoronary bypass graft: Secondary | ICD-10-CM

## 2021-11-18 DIAGNOSIS — G8314 Monoplegia of lower limb affecting left nondominant side: Secondary | ICD-10-CM | POA: Diagnosis present

## 2021-11-18 DIAGNOSIS — I6389 Other cerebral infarction: Secondary | ICD-10-CM | POA: Diagnosis not present

## 2021-11-18 DIAGNOSIS — Z888 Allergy status to other drugs, medicaments and biological substances status: Secondary | ICD-10-CM

## 2021-11-18 DIAGNOSIS — G459 Transient cerebral ischemic attack, unspecified: Secondary | ICD-10-CM | POA: Diagnosis not present

## 2021-11-18 DIAGNOSIS — I63342 Cerebral infarction due to thrombosis of left cerebellar artery: Secondary | ICD-10-CM

## 2021-11-18 DIAGNOSIS — G4733 Obstructive sleep apnea (adult) (pediatric): Secondary | ICD-10-CM | POA: Diagnosis present

## 2021-11-18 DIAGNOSIS — Z6829 Body mass index (BMI) 29.0-29.9, adult: Secondary | ICD-10-CM | POA: Diagnosis not present

## 2021-11-18 DIAGNOSIS — I252 Old myocardial infarction: Secondary | ICD-10-CM

## 2021-11-18 DIAGNOSIS — Z8673 Personal history of transient ischemic attack (TIA), and cerebral infarction without residual deficits: Secondary | ICD-10-CM | POA: Diagnosis not present

## 2021-11-18 DIAGNOSIS — I6350 Cerebral infarction due to unspecified occlusion or stenosis of unspecified cerebral artery: Secondary | ICD-10-CM

## 2021-11-18 DIAGNOSIS — Z87891 Personal history of nicotine dependence: Secondary | ICD-10-CM | POA: Diagnosis not present

## 2021-11-18 DIAGNOSIS — G458 Other transient cerebral ischemic attacks and related syndromes: Secondary | ICD-10-CM | POA: Diagnosis present

## 2021-11-18 DIAGNOSIS — E669 Obesity, unspecified: Secondary | ICD-10-CM | POA: Diagnosis present

## 2021-11-18 DIAGNOSIS — Z20822 Contact with and (suspected) exposure to covid-19: Secondary | ICD-10-CM | POA: Diagnosis present

## 2021-11-18 HISTORY — DX: Personal history of other diseases of the nervous system and sense organs: Z86.69

## 2021-11-18 LAB — I-STAT CHEM 8, ED
BUN: 14 mg/dL (ref 8–23)
Calcium, Ion: 1.13 mmol/L — ABNORMAL LOW (ref 1.15–1.40)
Chloride: 103 mmol/L (ref 98–111)
Creatinine, Ser: 0.8 mg/dL (ref 0.61–1.24)
Glucose, Bld: 102 mg/dL — ABNORMAL HIGH (ref 70–99)
HCT: 48 % (ref 39.0–52.0)
Hemoglobin: 16.3 g/dL (ref 13.0–17.0)
Potassium: 4.1 mmol/L (ref 3.5–5.1)
Sodium: 138 mmol/L (ref 135–145)
TCO2: 26 mmol/L (ref 22–32)

## 2021-11-18 LAB — URINALYSIS, ROUTINE W REFLEX MICROSCOPIC
Bacteria, UA: NONE SEEN
Bilirubin Urine: NEGATIVE
Glucose, UA: NEGATIVE mg/dL
Ketones, ur: NEGATIVE mg/dL
Leukocytes,Ua: NEGATIVE
Nitrite: NEGATIVE
Protein, ur: NEGATIVE mg/dL
Specific Gravity, Urine: 1.032 — ABNORMAL HIGH (ref 1.005–1.030)
pH: 6 (ref 5.0–8.0)

## 2021-11-18 LAB — DIFFERENTIAL
Abs Immature Granulocytes: 0.02 10*3/uL (ref 0.00–0.07)
Basophils Absolute: 0 10*3/uL (ref 0.0–0.1)
Basophils Relative: 1 %
Eosinophils Absolute: 0.3 10*3/uL (ref 0.0–0.5)
Eosinophils Relative: 5 %
Immature Granulocytes: 0 %
Lymphocytes Relative: 18 %
Lymphs Abs: 1.2 10*3/uL (ref 0.7–4.0)
Monocytes Absolute: 0.4 10*3/uL (ref 0.1–1.0)
Monocytes Relative: 6 %
Neutro Abs: 4.6 10*3/uL (ref 1.7–7.7)
Neutrophils Relative %: 70 %

## 2021-11-18 LAB — CBG MONITORING, ED: Glucose-Capillary: 91 mg/dL (ref 70–99)

## 2021-11-18 LAB — CBC
HCT: 49.6 % (ref 39.0–52.0)
Hemoglobin: 16.6 g/dL (ref 13.0–17.0)
MCH: 30 pg (ref 26.0–34.0)
MCHC: 33.5 g/dL (ref 30.0–36.0)
MCV: 89.5 fL (ref 80.0–100.0)
Platelets: 239 10*3/uL (ref 150–400)
RBC: 5.54 MIL/uL (ref 4.22–5.81)
RDW: 12.3 % (ref 11.5–15.5)
WBC: 6.6 10*3/uL (ref 4.0–10.5)
nRBC: 0 % (ref 0.0–0.2)

## 2021-11-18 LAB — RESP PANEL BY RT-PCR (FLU A&B, COVID) ARPGX2
Influenza A by PCR: NEGATIVE
Influenza B by PCR: NEGATIVE
SARS Coronavirus 2 by RT PCR: NEGATIVE

## 2021-11-18 LAB — RAPID URINE DRUG SCREEN, HOSP PERFORMED
Amphetamines: NOT DETECTED
Barbiturates: NOT DETECTED
Benzodiazepines: NOT DETECTED
Cocaine: NOT DETECTED
Opiates: NOT DETECTED
Tetrahydrocannabinol: NOT DETECTED

## 2021-11-18 LAB — COMPREHENSIVE METABOLIC PANEL
ALT: 38 U/L (ref 0–44)
AST: 34 U/L (ref 15–41)
Albumin: 3.7 g/dL (ref 3.5–5.0)
Alkaline Phosphatase: 62 U/L (ref 38–126)
Anion gap: 7 (ref 5–15)
BUN: 14 mg/dL (ref 8–23)
CO2: 25 mmol/L (ref 22–32)
Calcium: 8.8 mg/dL — ABNORMAL LOW (ref 8.9–10.3)
Chloride: 104 mmol/L (ref 98–111)
Creatinine, Ser: 0.79 mg/dL (ref 0.61–1.24)
GFR, Estimated: >60 mL/min (ref >60)
Glucose, Bld: 106 mg/dL — ABNORMAL HIGH (ref 70–99)
Potassium: 4.1 mmol/L (ref 3.5–5.1)
Sodium: 136 mmol/L (ref 135–145)
Total Bilirubin: 0.9 mg/dL (ref 0.3–1.2)
Total Protein: 6.8 g/dL (ref 6.5–8.1)

## 2021-11-18 LAB — PROTIME-INR
INR: 1 (ref 0.8–1.2)
Prothrombin Time: 13.5 seconds (ref 11.4–15.2)

## 2021-11-18 LAB — MRSA NEXT GEN BY PCR, NASAL: MRSA by PCR Next Gen: NOT DETECTED

## 2021-11-18 LAB — ETHANOL: Alcohol, Ethyl (B): <10 mg/dL (ref <10)

## 2021-11-18 LAB — APTT: aPTT: 27 seconds (ref 24–36)

## 2021-11-18 MED ORDER — MESALAMINE 1.2 G PO TBEC
1.2000 g | DELAYED_RELEASE_TABLET | Freq: Every day | ORAL | Status: DC
  Administered 2021-11-19: 1.2 g via ORAL
  Filled 2021-11-18: qty 1

## 2021-11-18 MED ORDER — SODIUM CHLORIDE 0.9 % IV SOLN: INTRAVENOUS | Status: DC

## 2021-11-18 MED ORDER — ROSUVASTATIN CALCIUM 20 MG PO TABS
40.0000 mg | ORAL_TABLET | Freq: Every day | ORAL | Status: DC
  Administered 2021-11-19: 40 mg via ORAL
  Filled 2021-11-18: qty 2

## 2021-11-18 MED ORDER — SODIUM CHLORIDE 0.9 % IV SOLN
INTRAVENOUS | Status: DC
Start: 2021-11-18 — End: 2021-11-18

## 2021-11-18 MED ORDER — EZETIMIBE 10 MG PO TABS
10.0000 mg | ORAL_TABLET | Freq: Every day | ORAL | Status: DC
  Administered 2021-11-19: 10 mg via ORAL
  Filled 2021-11-18: qty 1

## 2021-11-18 MED ORDER — IOHEXOL 350 MG/ML SOLN
75.0000 mL | Freq: Once | INTRAVENOUS | Status: AC | PRN
  Administered 2021-11-18: 75 mL via INTRAVENOUS

## 2021-11-18 MED ORDER — LABETALOL HCL 5 MG/ML IV SOLN: 5.0000 mg | INTRAVENOUS | Status: DC | PRN

## 2021-11-18 MED ORDER — TENECTEPLASE FOR STROKE: 0.2500 mg/kg | PACK | Freq: Once | INTRAVENOUS | Status: AC

## 2021-11-18 MED ORDER — TENECTEPLASE FOR STROKE
PACK | INTRAVENOUS | Status: AC
  Administered 2021-11-18: 23 mg via INTRAVENOUS
  Filled 2021-11-18: qty 10

## 2021-11-18 NOTE — ED Notes (Signed)
Pharmacy called to verify TNK. No answer

## 2021-11-18 NOTE — ED Notes (Signed)
Pt transported to CT ?

## 2021-11-18 NOTE — ED Triage Notes (Signed)
Patient to ED via EMS with complaints of left side facial numbness, headache, dizziness, headache and left leg weakness. Conflicting LKW. Per EMS he states earlier 0730. To this staff, he states he woke up with these symptoms. Patient states they have improved since waking. Hx of Bell's Palsy.

## 2021-11-18 NOTE — ED Notes (Signed)
Called CT and informed of order for CTA, states to bring pt in 5 minutes.

## 2021-11-18 NOTE — H&P (Signed)
Stroke Neurology H&P Note  The history was obtained from the pt and wife.  During history and examination, all items were able to obtain unless otherwise noted.  History of Present Illness:  Alejandro Sherman is a 76 y.o. 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   He stated that he woke up at 7:30 AM with mild headache but no other difficulties.  He took his medication in the morning including baby aspirin, walk to mailbox, got yesterday's mail and came back for coffee.  He sat in chair around 8:30 AM starting feeling hot and flush, continue to have mild headache at right frontal, and left facial tingling and slurred speech.  He got up went to bathroom but off balance and not up to walk.  Wife called EMS and patient sent to ER for evaluation.  In ER his headache gradually resolved.  BP 170/80, glucose 89.  CT no acute abnormality.   Per patient and wife, he had a left Bell's palsy 1980s, currently has left eyelid smaller than right.  However wife stated that his left face seems droopier than normal.  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  He follows with his PCP.   LKW: 8:30 AM tpa given?:  Yes Modified Rankin Scale: 1-No significant post stroke disability and can perform usual duties with stroke symptoms  Past Medical History:  Diagnosis Date   Bell's palsy    20 years ago, affecting the left facial nerve   CAD (coronary artery disease)    Edema    Essential hypertension, benign    History of Bell's palsy    Hyperlipidemia    Myocardial infarction ([place]) 10/28/1994   Obesity    OSA (obstructive sleep apnea)    on CPAP,  12-19-05   Ulcerative colitis ([place])     Past Surgical History:  Procedure Laterality Date   Ulcerative colitis  04/2010   Hospitalization    Family History  Problem Relation  Age of Onset   Kidney cancer Mother    Heart attack Father    Ovarian cancer Sister     Social History:  reports that he has quit smoking. He has never used smokeless tobacco. He reports current alcohol use. He reports that he does not use drugs.  Allergies:  Allergies  Allergen Reactions   [place] Grass Extract Other (See Comments)    Sneezing, itchy watery eyes    No current facility-administered medications on file prior to encounter.   Current Outpatient Medications on File Prior to Encounter  Medication Sig Dispense Refill   aspirin 81 MG tablet Take 81 mg by mouth daily.     Cholecalciferol 50 MCG (2000 UT) TABS Take by mouth.     ezetimibe (ZETIA) 10 MG tablet Take 10 mg by mouth daily.     fluticasone (FLONASE) 50 MCG/ACT nasal spray Place 2 sprays into both nostrils daily.     loratadine (CLARITIN) 10 MG tablet Take 10 mg by mouth.     meclizine (ANTIVERT) 12.5 MG tablet Take 12.5 mg by mouth 3 (three) times daily as needed.     mesalamine (LIALDA) 1.2 G EC tablet Take 1.2 g by mouth daily with breakfast.     metoprolol succinate (TOPROL-XL) 100 MG 24 hr tablet Take 100 mg by mouth.     nitroGLYCERIN (NITROSTAT) 0.4 MG SL tablet  Place 1 tablet under the tongue every 5 (five) minutes as needed.     ondansetron (ZOFRAN) 4 MG tablet Take 4 mg by mouth every 6 (six) hours as needed.     OVER THE COUNTER MEDICATION Eye vitamin     potassium chloride SA (KLOR-CON) 20 MEQ tablet Take 20 mEq by mouth 2 (two) times daily.     Rosuvastatin Calcium 40 MG CPSP Take 1 tablet by mouth daily.     spironolactone (ALDACTONE) 25 MG tablet Take 25 mg by mouth daily.      Review of Systems: A full ROS was attempted today and was able to be performed.  Systems assessed include - Constitutional, Eyes, HENT, Respiratory, Cardiovascular, Gastrointestinal, Genitourinary, Integument/breast, Hematologic/lymphatic, Musculoskeletal, Neurological, Behavioral/Psych, Endocrine, Allergic/Immunologic -  with pertinent responses as per HPI.  Physical Examination: Temp:  [97.8 F (36.6 C)-97.9 F (36.6 C)] 97.9 F (36.6 C) (01/22 1317) Pulse Rate:  [55-76] 55 (01/22 1317) Resp:  [10-19] 18 (01/22 1317) BP: (120-167)/(63-95) 127/66 (01/22 1317) SpO2:  [97 %-100 %] 98 % (01/22 1317) Weight:  [91.7 kg] 91.7 kg (01/22 1021)  General - Well nourished, well developed, in no apparent distress.   Ophthalmologic - fundi not visualized due to noncooperation.   Cardiovascular - Regular rhythm and rate.   Neuro - awake, alert, eyes open, orientated to age, place, time and people. No aphasia, fluent language, following all simple commands. Able to name and repeat and read. No gaze palsy, tracking bilaterally, visual field full. No facial droop. Tongue midline. Bilateral UEs 5/5, no drift. Bilaterally LEs 5/5, no drift. Sensation symmetrical bilaterally on light touch, however still complaining of mild abnormal feeling on the left face.  Left finger-to-nose mild dysmetria, right finger-to-nose and bilateral heel-to-shin seems intact.  On walking in ER room, patient ataxic gait, tendency to fall.  Data Reviewed: CT HEAD CODE STROKE WO CONTRAST  Result Date: 11/18/2021 CLINICAL DATA:  Code stroke.  76 year old male left side weakness. EXAM: CT HEAD WITHOUT CONTRAST TECHNIQUE: Contiguous axial images were obtained from the base of the skull through the vertex without intravenous contrast. RADIATION DOSE REDUCTION: This exam was performed according to the departmental dose-optimization program which includes automated exposure control, adjustment of the mA and/or kV according to patient size and/or use of iterative reconstruction technique. COMPARISON:  None. FINDINGS: Brain: Cerebral volume is within normal limits for age. No midline shift, ventriculomegaly, mass effect, evidence of mass lesion, intracranial hemorrhage or evidence of cortically based acute infarction. Gray-white matter differentiation appears  symmetric and within normal limits for age. Vascular: Calcified atherosclerosis at the skull base. No suspicious intracranial vascular hyperdensity. Skull: Negative. Sinuses/Orbits: Visualized paranasal sinuses and mastoids are clear. Tympanic cavities are clear. Other: Leftward gaze deviation. Normal stylomastoid foramina. Visualized scalp soft tissues are within normal limits. ASPECTS [facility] Stroke Program Early CT Score) Total score (0-10 with 10 being normal): 10. IMPRESSION: Normal for age non contrast CT appearance of the brain. ASPECTS 10. Study discussed by telephone with Dr. Doren Custard in the ED on 11/18/2021 at 10:13 . Electronically Signed   By: Genevie Ann M.D.   On: 11/18/2021 10:17    Assessment: 76 y.o. 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  Time onset 8:30 AM.  NIH score 2 with left upper extremity dysmetria and possible left  facial droop.  However, patient did have ataxic gait on walking in room.  BP 170/80 on presentation but recheck at 140s, glucose 89.  CT no acute abnormality.  Patient symptoms concerning for small posterior circulation infarct with ataxia.  Platelet and PT/PTT/INR normal range.  Patient not on anticoagulant at home.  BP within goal.  No conindication for TNK.  Discussed with patient and wife regarding benefit and risk of TNK, they agreed to proceed.  We will do CTA head and neck stat post TNK.   Recommendations:  Admit to ICU for routine post IV thrombolytics care  Check blood pressure and NIHSS every 15 min for 2 h, then every 30 min for 6 h, and finally every hour for 16 h BP goal < 180/105 CTA head and neck stat to rule out LVO CT or MRI brain 24 hours post thrombolytics Stat CT head without contrast if acute neuro changes NPO until swallowing  screen performed and passed No antiplatelet agents or anticoagulants (including heparin for DVT prophylaxis) in first 24 hours No Foley catheter, nasogastric tube, arterial catheter or central venous catheter for 24 hours, unless absolutely necessary Telemetry Euglycemia  Avoid hyperthermia, PRN acetaminophen DVT prophylaxis with SCDs  Rosalin Hawking, MD PhD Stroke Neurology 11/18/2021 2:42 PM

## 2021-11-18 NOTE — ED Provider Notes (Signed)
[facility] EMERGENCY DEPARTMENT Provider Note   CSN: [id number] Arrival date & time: 11/18/21  9794     History  Chief Complaint  Patient presents with   Code Stroke    Alejandro Sherman is a 76 y.o. male.  HPI      Alejandro Sherman is a 76 y.o. male with past medical history significant for coronary artery disease, hyperlipidemia, hypertension and Bell's palsy who presents to the Emergency Department complaining of left-sided facial weakness left-sided headache, dizziness, and weakness of his left leg.  Symptoms began at 8 AM upon waking.  Patient notes having a left-sided headache at that time.  Approximately 15 to 30 minutes later while drinking coffee, he experienced a "hot flash"  numbness and weakness of his left face with dizziness and slurred speech. Per report from EMS, patient had left leg weakness and unable to stand.  Here, he denies having any weakness of his lower extremities.  No prior CVA/TIA.  He took four 81 mg aspirin prior to arrival.  He did not experience chest pain or shortness of breath    Home Medications Prior to Admission medications   Medication Sig Start Date End Date Taking? Authorizing Provider  aspirin 81 MG tablet Take 81 mg by mouth daily.    [provider]  chlorthalidone (HYGROTON) 25 MG tablet TAKE 1 TABLET (25 MG TOTAL) BY MOUTH DAILY. 12/02/16   [provider]  ezetimibe (ZETIA) 10 MG tablet Take 10 mg by mouth daily.    [provider]  fluticasone (FLONASE) 50 MCG/ACT nasal spray Place 2 sprays into both nostrils daily.    [provider]  loratadine (CLARITIN) 10 MG tablet Take 10 mg by mouth.    [provider]  mesalamine (LIALDA) 1.2 G EC tablet Take 1.2 g by mouth daily with breakfast.    [provider]  metoprolol succinate (TOPROL-XL) 100 MG 24 hr tablet Take 100 mg by mouth. 11/25/16   [provider]  potassium chloride SA (KLOR-CON) 20 MEQ tablet Take 20 mEq by  mouth 2 (two) times daily.    [provider]  Rosuvastatin Calcium 40 MG CPSP TAKE ONE TABLET BY MOUTH ONCE A DAY FOR CHOLESTEROL 12/05/20   [provider]      Allergies    [place] grass extract    Review of Systems   Review of Systems  Respiratory:  Negative for shortness of breath.   Cardiovascular:  Negative for chest pain.  Gastrointestinal:  Negative for diarrhea and vomiting.  Genitourinary:  Negative for difficulty urinating.  Neurological:  Positive for dizziness, weakness, numbness and headaches.  All other systems reviewed and are negative.  Physical Exam Updated Vital Signs BP 122/68    Pulse 65    Temp 97.9 F (36.6 C)    Resp 11    Ht 5\' 9"  (1.753 m)    Wt 91.7 kg    SpO2 99%    BMI 29.85 kg/m  Physical Exam Vitals and nursing note reviewed.  Constitutional:      General: He is not in acute distress.    Appearance: Normal appearance. He is not ill-appearing or toxic-appearing.  HENT:     Mouth/Throat:     Mouth: Mucous membranes are moist.  Eyes:     Extraocular Movements:     Right eye: Normal extraocular motion.     Conjunctiva/sclera: Conjunctivae normal.     Pupils: Pupils are equal, round, and reactive to light.  Comments: Patient has right-sided gaze deficit  Cardiovascular:     Rate and Rhythm: Normal rate and regular rhythm.     Pulses: Normal pulses.  Pulmonary:     Effort: Pulmonary effort is normal.  Abdominal:     Palpations: Abdomen is soft.     Tenderness: There is no abdominal tenderness.  Musculoskeletal:        General: No swelling. Normal range of motion.     Cervical back: No tenderness.     Right lower leg: No edema.     Left lower leg: No edema.  Skin:    General: Skin is warm.     Capillary Refill: Capillary refill takes less than 2 seconds.     Findings: No rash.  Neurological:     Mental Status: He is alert and oriented to person, place, and time.     Cranial Nerves: Facial asymmetry present.      Sensory: Sensation is intact.     Motor: No weakness or pronator drift.     Coordination: Coordination normal. Finger-Nose-Finger Test normal.     Comments: Patient with left-sided facial droop, right visual gaze deficit. No deviation of the tongue.  Pt attempted to ambulate, ataxic gait.      ED Results / Procedures / Treatments   Labs (all labs ordered are listed, but only abnormal results are displayed) Labs Reviewed  COMPREHENSIVE METABOLIC PANEL - Abnormal; Notable for the following components:      Result Value   Glucose, Bld 106 (*)    Calcium 8.8 (*)    All other components within normal limits  I-STAT CHEM 8, ED - Abnormal; Notable for the following components:   Glucose, Bld 102 (*)    Calcium, Ion 1.13 (*)    All other components within normal limits  RESP PANEL BY RT-PCR (FLU A&B, COVID) ARPGX2  ETHANOL  PROTIME-INR  APTT  CBC  DIFFERENTIAL  RAPID URINE DRUG SCREEN, HOSP PERFORMED  URINALYSIS, ROUTINE W REFLEX MICROSCOPIC  CBG MONITORING, ED    EKG EKG Interpretation  Date/Time:  Sunday November 18 2021 10:17:34 EST Ventricular Rate:  63 PR Interval:  194 QRS Duration: 94 QT Interval:  446 QTC Calculation: 457 R Axis:   14 Text Interpretation: Sinus rhythm Probable inferior infarct, old Confirmed by Gloris Manchesterixon, Ryan [phone]) on 11/18/2021 11:29:35 AM  Radiology CT HEAD CODE STROKE WO CONTRAST  Result Date: 11/18/2021 CLINICAL DATA:  Code stroke.  76 year old male left side weakness. EXAM: CT HEAD WITHOUT CONTRAST TECHNIQUE: Contiguous axial images were obtained from the base of the skull through the vertex without intravenous contrast. RADIATION DOSE REDUCTION: This exam was performed according to the departmental dose-optimization program which includes automated exposure control, adjustment of the mA and/or kV according to patient size and/or use of iterative reconstruction technique. COMPARISON:  None. FINDINGS: Brain: Cerebral volume is within normal limits for age.  No midline shift, ventriculomegaly, mass effect, evidence of mass lesion, intracranial hemorrhage or evidence of cortically based acute infarction. Gray-white matter differentiation appears symmetric and within normal limits for age. Vascular: Calcified atherosclerosis at the skull base. No suspicious intracranial vascular hyperdensity. Skull: Negative. Sinuses/Orbits: Visualized paranasal sinuses and mastoids are clear. Tympanic cavities are clear. Other: Leftward gaze deviation. Normal stylomastoid foramina. Visualized scalp soft tissues are within normal limits. ASPECTS [facility] Stroke Program Early CT Score) Total score (0-10 with 10 being normal): 10. IMPRESSION: Normal for age non contrast CT appearance of the brain. ASPECTS 10. Study discussed by telephone with  Dr. Durwin Nora in the ED on 11/18/2021 at 10:13 . Electronically Signed   By: Odessa Fleming M.D.   On: 11/18/2021 10:17    Procedures Procedures    Medications Ordered in ED Medications - No data to display  ED Course/ Medical Decision Making/ A&P                            CRITICAL CARE Performed by: Nikos Anglemyer Total critical care time: 40 minutes Critical care time was exclusive of separately billable procedures and treating other patients. Critical care was necessary to treat or prevent imminent or life-threatening deterioration. Critical care was time spent personally by me on the following activities: development of treatment plan with patient and/or surrogate as well as nursing, discussions with consultants, evaluation of patient's response to treatment, examination of patient, obtaining history from patient or surrogate, ordering and performing treatments and interventions, ordering and review of laboratory studies, ordering and review of radiographic studies, pulse oximetry and re-evaluation of patient's condition.  Medical Decision Making  This patient presents to the ED for concern of possible stroke, this involves an extensive  number of treatment options, and is a complaint that carries with it a high risk of complications and morbidity.  The differential diagnosis includes acute stroke, TIA, Bell's palsy, atypical migraine   Co morbidities that complicate the patient evaluation  Coronary artery disease, prior MI hypertension, hyperlipidemia former tobacco user   Additional history obtained:  Additional history obtained from prior medical records  Lab Tests:  I Ordered, and personally interpreted labs.  The pertinent results include: No leukocytosis, no significant electrolyte derangement.   Imaging Studies ordered:  I ordered imaging studies including CT head I independently visualized and interpreted imaging which showed no acute intracranial process I agree with the radiologist interpretation   Cardiac Monitoring:  The patient was maintained on a cardiac monitor.  I personally viewed and interpreted the cardiac monitored which showed an underlying rhythm of: Sinus rhythm     Test Considered:  Stroke protocol CT head   Critical Interventions:  Teleneurology recommended TNK   Consultations Obtained:  I requested consultation with the teleneurology, Dr. Marvel Plan  Patient will need to be transferred to tertiary center.  Patient requesting transfer to [facility].  Will consult neuro ICU regarding bed availability  Informed by nursing staff that no neuro ICU beds are available at [facility] at this time.  Discussed this with patient and spouse at bedside they are agreeable to transfer to [facility].    Reevaluation:  After the interventions noted above, I reevaluated the patient and found that they have : Stayed the same   Social Determinants of Health:  none   Dispostion:  After consideration of the diagnostic results and the patients response to treatment, I feel that the patent would benefit from transfer to [facility].  Patient and family agreeable to this plan         Final Clinical  Impression(s) / ED Diagnoses Final diagnoses:  Cerebrovascular accident (CVA), unspecified mechanism [facility])    Rx / DC Orders ED Discharge Orders     None         Rosey Bath 11/18/21 1157    Gloris Manchester, MD 11/19/21 [phone]

## 2021-11-18 NOTE — Progress Notes (Incomplete)
Code Stroke time documentation  952 Call time 1001 Exam started 1004 Exam finished 1004 Images sent to [facility] 1009 Exam completed in Epic [address] [facility] Radiology called

## 2021-11-18 NOTE — ED Notes (Signed)
Went with RN ME to CT came back , put socks on pt and new finger pulse

## 2021-11-18 NOTE — ED Notes (Signed)
Pt passed stroke swallow screen--MD made aware  

## 2021-11-18 NOTE — Consult Note (Addendum)
[facility] Telemedicine Consult   Requesting Provider: Dr. 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  He stated that he woke up at 7:30 AM with mild headache but no other difficulties.  He took his medication in the morning including baby aspirin, walk to mailbox, got yesterday's mail and came back for coffee.  He sat in chair around 8:30 AM starting feeling hot and flush, continue to have mild headache at right frontal, and left facial tingling and slurred speech.  He got up went to bathroom but off balance and not up to walk.  Wife called EMS and patient sent to ER for evaluation.  In ER his headache gradually resolved.  BP 170/80, glucose 89.  CT no acute abnormality.  Per patient and wife, he had a left Bell's palsy 1980s, currently has left eyelid smaller than right.  However wife stated that his left face seems droopier than normal.  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  He follows with his PCP.  LKW: 8:30 AM tpa given?:  Yes Modified Rankin Scale: 1-No significant post stroke disability and can perform usual duties with stroke symptoms   Exam: Vitals:   11/18/21 1055 11/18/21 1100  BP: (!) 147/64 (!) 144/70  Pulse: 65 62  Resp: 19 17  Temp:    SpO2: 99% 99%     Temp:  [97.9 F (36.6 C)] 97.9 F (36.6 C) (01/22 1021) Pulse Rate:  [62-76] 62 (01/22 1100) Resp:  [10-19] 17 (01/22 1100) BP: (143-167)/(64-95) 144/70 (01/22 1100) SpO2:  [97 %-100 %] 99 % (01/22 1100) Weight:  [91.7 kg] 91.7 kg (01/22 1021)  General - Well nourished, well developed, in no apparent distress.  Ophthalmologic - fundi not visualized due to noncooperation.  Cardiovascular - Regular rhythm and rate.  Neuro -  awake, alert, eyes open, orientated to age, place, time and people. No aphasia, fluent language, following all simple commands. Able to name and repeat and read. No gaze palsy, tracking bilaterally, visual field full. No facial droop. Tongue midline. Bilateral UEs 5/5, no drift. Bilaterally LEs 5/5, no drift. Sensation symmetrical bilaterally on light touch, however still complaining of mild abnormal feeling on the left face.  Left finger-to-nose mild dysmetria, right finger-to-nose and bilateral heel-to-shin seems intact.  On walking in ER room, patient ataxic gait, tendency to fall.   NIH Stroke Scale  Level Of Consciousness 0=Alert; keenly responsive 1=Arouse to minor stimulation 2=Requires repeated stimulation to arouse or movements to pain 3=postures or unresponsive 0  LOC Questions to Month and Age 51=Answers both questions correctly 1=Answers one question correctly or dysarthria/intubated/trauma/language barrier 2=Answers neither question correctly or aphasia 0  LOC Commands      -Open/Close eyes     -Open/close grip     -Pantomime commands if communication barrier 0=Performs both tasks correctly 1=Performs one task correctly 2=Performs neighter task correctly 0  Best Gaze     -Only assess horizontal gaze 0=Normal 1=Partial gaze palsy 2=Forced deviation, or total gaze paresis 0  Visual 0=No visual loss 1=Partial hemianopia 2=Complete hemianopia 3=Bilateral hemianopia (blind including cortical blindness) 0  Facial Palsy     -Use grimace if obtunded 0=Normal symmetrical movement 1=Minor paralysis (asymmetry) 2=Partial paralysis (lower face) 3=Complete paralysis (upper and lower face) 1  Motor  0=No  drift for 10/5 seconds 1=Drift, but does not hit bed 2=Some antigravity effort, hits  bed 3=No effort against gravity, limb falls 4=No movement 0=Amputation/joint fusion Right Arm 0     Leg 0    Left Arm 0     Leg 0  Limb Ataxia     - FNT/HTS 0=Absent or does not understand or  paralyzed or amputation/joint fusion 1=Present in one limb 2=Present in two limbs 1  Sensory 0=Normal 1=Mild to moderate sensory loss 2=Severe to total sensory loss or coma/unresponsive 0  Best Language 0=No aphasia, normal 1=Mild to moderate aphasia 2=Severe aphasia 3=Mute, global aphasia, or coma/unresponsive 0  Dysarthria 0=Normal 1=Mild to moderate 2=Severe, unintelligible or mute/anarthric 0=intubated/unable to test 0  Extinction/Neglect 0=No abnormality 1=visual/tactile/auditory/spatia/personal inattention/Extinction to bilateral simultaneous stimulation 2=Profound neglect/extinction more than 1 modality  0  Total   2      Imaging Reviewed:  CT HEAD CODE STROKE WO CONTRAST  Result Date: 11/18/2021 CLINICAL DATA:  Code stroke.  76 year old male left side weakness. EXAM: CT HEAD WITHOUT CONTRAST TECHNIQUE: Contiguous axial images were obtained from the base of the skull through the vertex without intravenous contrast. RADIATION DOSE REDUCTION: This exam was performed according to the departmental dose-optimization program which includes automated exposure control, adjustment of the mA and/or kV according to patient size and/or use of iterative reconstruction technique. COMPARISON:  None. FINDINGS: Brain: Cerebral volume is within normal limits for age. No midline shift, ventriculomegaly, mass effect, evidence of mass lesion, intracranial hemorrhage or evidence of cortically based acute infarction. Gray-white matter differentiation appears symmetric and within normal limits for age. Vascular: Calcified atherosclerosis at the skull base. No suspicious intracranial vascular hyperdensity. Skull: Negative. Sinuses/Orbits: Visualized paranasal sinuses and mastoids are clear. Tympanic cavities are clear. Other: Leftward gaze deviation. Normal stylomastoid foramina. Visualized scalp soft tissues are within normal limits. ASPECTS [facility] Stroke Program Early CT Score) Total score (0-10 with 10  being normal): 10. IMPRESSION: Normal for age non contrast CT appearance of the brain. ASPECTS 10. Study discussed by telephone with Dr. Doren Custard in the ED on 11/18/2021 at 10:13 . Electronically Signed   By: Genevie Ann M.D.   On: 11/18/2021 10:17     Labs reviewed in epic and pertinent values follow:  Latest Reference Range & Units 11/18/21 10:09  WBC 4.0 - 10.5 K/uL 6.6  RBC 4.22 - 5.81 MIL/uL 5.54  Hemoglobin 13.0 - 17.0 g/dL 16.6  HCT 39.0 - 52.0 % 49.6  MCV 80.0 - 100.0 fL 89.5  MCH 26.0 - 34.0 pg 30.0  MCHC 30.0 - 36.0 g/dL 33.5  RDW 11.5 - 15.5 % 12.3  Platelets 150 - 400 K/uL 239  nRBC 0.0 - 0.2 % 0.0    Latest Reference Range & Units 11/18/21 10:09  Prothrombin Time 11.4 - 15.2 seconds 13.5  INR 0.8 - 1.2  1.0  APTT 24 - 36 seconds 27    Assessment:  76 year old male with history of hypertension, hyperlipidemia, OSA on CPAP, left Bell's palsy in 1980s, 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  Time onset 8:30 AM.  NIH score 2 with left upper extremity dysmetria and possible left facial droop.  However, patient did have ataxic gait on walking in room.  BP 170/80 on presentation but recheck at 140s, glucose 89.  CT no acute abnormality.  Patient symptoms concerning for small posterior circulation infarct with ataxia.  Platelet and PT/PTT/INR normal  range.  Patient not on anticoagulant at home.  BP within goal.  No conindication for TNK.  Discussed with patient and wife regarding benefit and risk of TNK, they agreed to proceed.  We will do CTA head and neck stat post TNK.  I have explained to the patient and wife the nature of the patient's condition, the use of fibrinolytic agent, and the benefits to be reasonably expected compared with alternative approaches. However, there is a small but real risk of complication with this medication, including (if applicable) but not limited to  permanent neurologic injury (paralysis, coma, etc.), bleeding in the brain or other part of the body, need for blood product transfusion, drug reactions, and even loss of life. I have also indicated that with any procedure there is always the possibility of an unexpected complication. All questions were answered prior to initiating therapy and they express understanding of the treatment plan and consent to the treatment.   Recommendations:  Transfer to [facility] for routine post IV thrombolytics care  Check blood pressure and NIHSS every 15 min for 2 h, then every 30 min for 6 h, and finally every hour for 16 h BP goal < 180/105 CTA head and neck stat to rule out LVO CT or MRI brain 24 hours post thrombolytics Stat CT head without contrast if acute neuro changes NPO until swallowing screen performed and passed No antiplatelet agents or anticoagulants (including heparin for DVT prophylaxis) in first 24 hours No Foley catheter, nasogastric tube, arterial catheter or central venous catheter for 24 hours, unless absolutely necessary Telemetry Euglycemia  Avoid hyperthermia, PRN acetaminophen DVT prophylaxis with SCDs Inpatient Neurology Consultation Discussed with Dr. Doren Custard ED physician Will follow   Consult Participants: RN, patient, wife, me, Tele stroke coordinator Location of the provider: [facility] Location of the patient: [facility]  Time Code Stroke Page received:  not sure, Dr. Quinn Axe received the pager Time neurologist arrived:  10:15am Time NIHSS completed: 10:32am    This consult was provided via telemedicine with 2-way video and audio communication. The patient/family was informed that care would be provided in this way and agreed to receive care in this manner.   This patient is critically ill due to acute neurological changes, possible stroke, received the TN K and at significant risk of neurological worsening, death form hemorrhagic conversion, bleeding from TN K. This  patient's care requires constant monitoring of vital signs, hemodynamics, respiratory and cardiac monitoring, review of multiple databases, neurological assessment, discussion with family, other specialists and medical decision making of high complexity. I spent 60 minutes of neurocritical care time in the care of this patient.    Rosalin Hawking, MD PhD Stroke Neurology 11/18/2021 11:13 AM

## 2021-11-18 NOTE — ED Notes (Signed)
Pt's wife states that pt has a Hx of Bells Palsy and that pt has a L-sided facial droop at baseline--However, she stated the droop was more prominent before pt arrived at the hospital.

## 2021-11-18 NOTE — ED Notes (Signed)
Code Stroke paged out called in field. PA at patient side for evaluation. CT called.

## 2021-11-18 NOTE — ED Notes (Signed)
Pharmacy called to verify TNK, spoke with pharmacist.

## 2021-11-19 ENCOUNTER — Inpatient Hospital Stay (HOSPITAL_COMMUNITY): Payer: Medicare Other

## 2021-11-19 ENCOUNTER — Other Ambulatory Visit: Payer: Self-pay | Admitting: *Deleted

## 2021-11-19 ENCOUNTER — Other Ambulatory Visit: Payer: Self-pay | Admitting: Physician Assistant

## 2021-11-19 DIAGNOSIS — Z8673 Personal history of transient ischemic attack (TIA), and cerebral infarction without residual deficits: Secondary | ICD-10-CM

## 2021-11-19 DIAGNOSIS — I6389 Other cerebral infarction: Secondary | ICD-10-CM

## 2021-11-19 DIAGNOSIS — I639 Cerebral infarction, unspecified: Secondary | ICD-10-CM

## 2021-11-19 DIAGNOSIS — Z951 Presence of aortocoronary bypass graft: Secondary | ICD-10-CM

## 2021-11-19 DIAGNOSIS — I63342 Cerebral infarction due to thrombosis of left cerebellar artery: Secondary | ICD-10-CM

## 2021-11-19 DIAGNOSIS — G459 Transient cerebral ischemic attack, unspecified: Secondary | ICD-10-CM

## 2021-11-19 DIAGNOSIS — I4891 Unspecified atrial fibrillation: Secondary | ICD-10-CM

## 2021-11-19 LAB — ECHOCARDIOGRAM COMPLETE
Area-P 1/2: 2.62 cm2
Height: 69 in
S' Lateral: 3.1 cm
Single Plane A4C EF: 53.8 %
Weight: 3181.68 oz

## 2021-11-19 LAB — CBG MONITORING, ED: Glucose-Capillary: 89 mg/dL (ref 70–99)

## 2021-11-19 MED ORDER — CLOPIDOGREL BISULFATE 75 MG PO TABS: 75.0000 mg | ORAL_TABLET | Freq: Every day | ORAL | 1 refills | Status: DC

## 2021-11-19 MED ORDER — ASPIRIN EC 81 MG PO TBEC
81.0000 mg | DELAYED_RELEASE_TABLET | Freq: Every day | ORAL | Status: DC
  Administered 2021-11-19: 81 mg via ORAL
  Filled 2021-11-19: qty 1

## 2021-11-19 MED ORDER — CLOPIDOGREL BISULFATE 75 MG PO TABS
75.0000 mg | ORAL_TABLET | Freq: Every day | ORAL | Status: DC
  Administered 2021-11-19: 75 mg via ORAL
  Filled 2021-11-19: qty 1

## 2021-11-19 NOTE — Progress Notes (Signed)
°  Echocardiogram 2D Echocardiogram has been performed.  Delcie Roch 11/19/2021, 9:19 AM

## 2021-11-19 NOTE — Evaluation (Signed)
Physical Therapy Evaluation and Discharge Patient Details Name: Alejandro Sherman MRN: [id number] DOB: 02/19/1946 Today's Date: 11/19/2021  History of Present Illness  Alejandro Sherman is a 76 y.o.  male who presents with mild HA, L facial tingling, and slurred speech. Pt received TNK in ED. 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 All education has been completed and the patient has no further questions. Pt has returned to baseline on eval with no strength or balance deficits noted during 500' ambulation and stairs. Pt with normal speech and cognition on eval. Reviewed stroke sxs. No further needs.   See below for any follow-up Physical Therapy or equipment needs. PT is signing off. Thank you for this referral.        Recommendations for follow up therapy are one component of a multi-disciplinary discharge planning process, led by the attending physician.  Recommendations may be updated based on patient status, additional functional criteria and insurance authorization.  Follow Up Recommendations No PT follow up    Assistance Recommended at Discharge None  Patient can return home with the following       Equipment Recommendations None recommended by PT  Recommendations for Other Services       Functional Status Assessment Patient has not had a recent decline in their functional status     Precautions / Restrictions Precautions Precautions: None Restrictions Weight Bearing Restrictions: No      Mobility  Bed Mobility Overal bed mobility: Independent                  Transfers Overall transfer level: Independent Equipment used: None                    Ambulation/Gait Ambulation/Gait assistance: Modified independent (Device/Increase time) Gait Distance (Feet): 500 Feet Assistive  device: None Gait Pattern/deviations: WFL(Within Functional Limits) Gait velocity: WFL Gait velocity interpretation: >4.37 ft/sec, indicative of normal walking speed   General Gait Details: pt able to maintain normal pace, change directions quickly, ambulate bkwds, and perform head turns with no LOB  Stairs Stairs: Yes   Stair Management: One rail Right, Alternating pattern, Forwards Number of Stairs: 5 General stair comments: no difficulties on stairs  Wheelchair Mobility    Modified Rankin (Stroke Patients Only) Modified Rankin (Stroke Patients Only) Pre-Morbid Rankin Score: No symptoms Modified Rankin: No symptoms     Balance Overall balance assessment: Independent                                           Pertinent Vitals/Pain Pain Assessment Pain Assessment: No/denies pain    Home Living Family/patient expects to be discharged to:: Private residence Living Arrangements: Spouse/significant other Available Help at Discharge: Family;Available 24 hours/day Type of Home: House Home Access: Stairs to enter Entrance Stairs-Rails: Right Entrance Stairs-Number of Steps: 3 Alternate Level Stairs-Number of Steps: flight Home Layout: Two level;Able to live on main level with bedroom/bathroom Home Equipment: None Additional Comments: retired from [employer], likes to [employer] in free time    Prior Function Prior Level of Function : Independent/Modified Independent;Driving             Mobility Comments: does not use AD ADLs Comments: independent     Hand Dominance  Dominant Hand: Right    Extremity/Trunk Assessment   Upper Extremity Assessment Upper Extremity Assessment: Overall WFL for tasks assessed    Lower Extremity Assessment Lower Extremity Assessment: Overall WFL for tasks assessed;LLE deficits/detail LLE Deficits / Details: LLE swelling, has at baseline, reports is mildly worsened today. Sockline on ankle L>R LLE Sensation:  WNL LLE Coordination: WNL    Cervical / Trunk Assessment Cervical / Trunk Assessment: Normal  Communication   Communication: No difficulties  Cognition Arousal/Alertness: Awake/alert Behavior During Therapy: WFL for tasks assessed/performed Overall Cognitive Status: Within Functional Limits for tasks assessed                                 General Comments: pt was able to answer questions, use his cell phone, write his name legibly. Wife reports he may be slightly slower to respond than normal, pt reports he feels completely back to baseline        General Comments General comments (skin integrity, edema, etc.): VSS throughout. Discussed stroke sxs    Exercises     Assessment/Plan    PT Assessment Patient does not need any further PT services  PT Problem List         PT Treatment Interventions      PT Goals (Current goals can be found in the Care Plan section)  Acute Rehab PT Goals Patient Stated Goal: return home PT Goal Formulation: All assessment and education complete, DC therapy    Frequency       Co-evaluation               AM-PAC PT "6 Clicks" Mobility  Outcome Measure Help needed turning from your back to your side while in a flat bed without using bedrails?: None Help needed moving from lying on your back to sitting on the side of a flat bed without using bedrails?: None Help needed moving to and from a bed to a chair (including a wheelchair)?: None Help needed standing up from a chair using your arms (e.g., wheelchair or bedside chair)?: None Help needed to walk in hospital room?: None Help needed climbing 3-5 steps with a railing? : None 6 Click Score: 24    End of Session Equipment Utilized During Treatment: Gait belt Activity Tolerance: Patient tolerated treatment well Patient left: in chair;with call bell/phone within reach;with family/visitor present Nurse Communication: Mobility status PT Visit Diagnosis: Unsteadiness on  feet (R26.81)    Time: 1245-8099 PT Time Calculation (min) (ACUTE ONLY): 22 min   Charges:   PT Evaluation $PT Eval Moderate Complexity: 1 Mod          [place], PT  Acute Rehab Services  Pager [phone] Office [phone]   Lawana Chambers Jacorie Ernsberger 11/19/2021, 1:40 PM

## 2021-11-19 NOTE — Progress Notes (Signed)
°  Transition of Care [facility]) Screening Note   Patient Details  Name: Mcadoo Muzquiz Date of Birth: 1946-05-27   Transition of Care [facility]) CM/SW Contact:    Mearl Latin, LCSW Phone Number: 11/19/2021, 11:04 AM    Transition of Care Department [facility]) has reviewed patient and no TOC needs have been identified at this time. We will continue to monitor patient advancement through interdisciplinary progression rounds. If new patient transition needs arise, please place a TOC consult.

## 2021-11-19 NOTE — Evaluation (Signed)
Occupational Therapy Evaluation Patient Details Name: Alejandro Sherman MRN: [id number] DOB: Jul 27, 1946 Today's Date: 11/19/2021   History of Present Illness Alejandro Sherman is a 76 y.o.  male who presents with mild HA, L facial tingling, and slurred speech. Pt received TNK in ED. MRI (-) for acute CVA; chronic infarct L occipital pole. 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 Pt states that he has been having "problems with his vision", but that his vision was "worse last night". Pt describes it as "geometric shapes" in his vision. Pt assessed with the Medi-Cog assessment of cognition. As indicated below, pt scored a 5/10, indicating cognitive impairment and need for direct S with his medication management. Pt has apparently been assessed by a neuropsychologist for changes in cognition PTA and pt/wife report his "problems are vascular in nature" per the neuropsychologist. Recommend pt refrain from driving at this time, undergo a Full Visual Field Assessment to further assess visual fields and have direct S with medication management. No OT follow up recommended at this time, however if pt does not return to what is considered his baseline by the time they follow up with neurology, an OT referral can be made at that time. Pt/wife in agreement.      Recommendations for follow up therapy are one component of a multi-disciplinary discharge planning process, led by the attending physician.  Recommendations may be updated based on patient status, additional functional criteria and insurance authorization.   Follow Up Recommendations  No OT follow up    Assistance Recommended at Discharge Intermittent Supervision/Assistance  Patient can return home with the following Direct supervision/assist for medications management;Direct supervision/assist for financial  management;Assist for transportation;Assistance with cooking/housework    Functional Status Assessment  Patient has not had a recent decline in their functional status  Equipment Recommendations  None recommended by OT    Recommendations for Other Services       Precautions / Restrictions Precautions Precautions: None Restrictions Weight Bearing Restrictions: No      Mobility Bed Mobility               General bed mobility comments: in chair    Transfers Overall transfer level: Independent Equipment used: None                      Balance Overall balance assessment: No apparent balance deficits (not formally assessed)                                         ADL either performed or assessed with clinical judgement   ADL Overall ADL's : Needs assistance/impaired                                     Functional mobility during ADLs: Modified independent General ADL Comments: baseline for basic ADL tasks; recommend direct S with IADL tasks     Vision Baseline Vision/History: 1 Wears glasses Vision Assessment?: Vision impaired- to be further tested in functional context Additional Comments: reports seeing "images" in visual filed, indicated on his Right side with his hand; gross assessmetn field are intact, however states that he had visual changes before administration of tnK; rec full  field visul assessment     Perception Perception Comments: appears intact   Praxis      Pertinent Vitals/Pain Pain Assessment Pain Assessment: No/denies pain     Hand Dominance Right   Extremity/Trunk Assessment Upper Extremity Assessment Upper Extremity Assessment: Overall WFL for tasks assessed   Lower Extremity Assessment Lower Extremity Assessment: Overall WFL for tasks assessed;LLE deficits/detail LLE Deficits / Details: LLE swelling, has at baseline, reports is mildly worsened today. Sockline on ankle L>R LLE Sensation:  WNL LLE Coordination: WNL   Cervical / Trunk Assessment Cervical / Trunk Assessment: Normal   Communication Communication Communication: No difficulties   Cognition Arousal/Alertness: Awake/alert Behavior During Therapy: WFL for tasks assessed/performed Overall Cognitive Status: Impaired/Different from baseline                                 General Comments: Assessed with the MediCog. Pt scored a 4/5 on the [place] portion due to recalling 2/3 wrods on delayed recall; Scored a 1/5 on the Medicaiton Transfer Form on the Medicog portion, demonstrating difficulty with problem solving and self correcting errors. Overall score was 5/10, indicating need for direct S with all medicaiton management and need for further cognitive assessment.     General Comments  VSS throughout. Discussed stroke sxs    Exercises     Shoulder Instructions      Home Living Family/patient expects to be discharged to:: Private residence Living Arrangements: Spouse/significant other Available Help at Discharge: Family;Available 24 hours/day Type of Home: House Home Access: Stairs to enter [employer] of Steps: 3 Entrance Stairs-Rails: Right Home Layout: Two level;Able to live on main level with bedroom/bathroom Alternate Level Stairs-Number of Steps: flight Alternate Level Stairs-Rails: Right Bathroom Shower/Tub: Producer, television/film/video: Standard     Home Equipment: None   Additional Comments: retired from [employer], likes to [employer] in free time      Prior Functioning/Environment Prior Level of Function : Independent/Modified Independent;Driving             Mobility Comments: does not use AD ADLs Comments: independent        OT Problem List: Decreased cognition      OT Treatment/Interventions:      OT Goals(Current goals can be found in the care plan section) Acute Rehab OT Goals Patient Stated Goal: to go home OT Goal Formulation: All  assessment and education complete, DC therapy  OT Frequency:      Co-evaluation              AM-PAC OT "6 Clicks" Daily Activity     Outcome Measure Help from another person eating meals?: None Help from another person taking care of personal grooming?: None Help from another person toileting, which includes using toliet, bedpan, or urinal?: None Help from another person bathing (including washing, rinsing, drying)?: None Help from another person to put on and taking off regular upper body clothing?: None Help from another person to put on and taking off regular lower body clothing?: None 6 Click Score: 24   End of Session Nurse Communication: Other (comment) (DC recommendations)  Activity Tolerance: Patient tolerated treatment well Patient left: in chair;with call bell/phone within reach  OT Visit Diagnosis: Other symptoms and signs involving cognitive function                Time: 1340-1410 OT Time Calculation (min): 30 min Charges:  OT General Charges $OT  Visit: 1 Visit OT Evaluation $OT Eval Moderate Complexity: 1 Mod OT Treatments $Therapeutic Activity: 8-22 mins  Luisa DagoHilary Laurin Morgenstern, OT/L   Acute OT Clinical Specialist Acute Rehabilitation Services Pager [phone] Office [phone]   [facility] LLCWARD,HILLARY 11/19/2021, 3:45 PM

## 2021-11-19 NOTE — TOC CAGE-AID Note (Signed)
Transition of Care [facility]) - CAGE-AID Screening   Patient Details  Name: Cassandra Mcmanaman MRN: [id number] Date of Birth: 1945-12-24  Transition of Care [facility]) CM/SW Contact:    Lossie Faes Tarpley-Carter, LCSWA Phone Number: 11/19/2021, 3:25 PM   Clinical Narrative: Pt participated in Cage-Aid.  Pt stated he does not use substance or ETOH (socially).  Pt was not offered resources, due to no usage of substance or ETOH.    Waverly Tarquinio Tarpley-Carter, MSW, LCSW-A Pronouns:  She/Her/Hers [place] Transitions of Care Clinical Social Worker Direct Number:  [phone] [email or web address]  CAGE-AID Screening:    Have You Ever Felt You Ought to Cut Down on Your Drinking or Drug Use?: No Have People Annoyed You By [employer] Your Drinking Or Drug Use?: No Have You Felt Bad Or Guilty About Your Drinking Or Drug Use?: No Have You Ever Had a Drink or Used Drugs First Thing In The Morning to Steady Your Nerves or to Get Rid of a Hangover?: No CAGE-AID Score: 0  Substance Abuse Education Offered: No

## 2021-11-19 NOTE — Discharge Summary (Addendum)
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   Cholecalciferol 50 MCG (2000 UT) Tabs Take by mouth.   clopidogrel 75 MG tablet Commonly known as: PLAVIX Take 1 tablet (75 mg total) by mouth daily.   ezetimibe 10 MG tablet Commonly known as: ZETIA Take 10 mg by mouth daily.   fluticasone 50 MCG/ACT nasal spray Commonly known as: FLONASE Place 2 sprays into both nostrils daily.   loratadine 10 MG tablet Commonly known as: CLARITIN Take 10 mg by mouth.   meclizine 12.5 MG tablet Commonly known as: ANTIVERT Take 12.5 mg by mouth 3 (three) times daily as needed.   mesalamine 1.2 g EC tablet Commonly known as: LIALDA Take 1.2 g by mouth daily with breakfast.   metoprolol succinate 100 MG 24 hr tablet Commonly known as: TOPROL-XL Take 100 mg by mouth.   nitroGLYCERIN 0.4 MG SL tablet Commonly known as: NITROSTAT Place 1 tablet under the tongue every 5 (five) minutes as needed.   ondansetron 4 MG tablet Commonly known as: ZOFRAN Take 4 mg by mouth every 6 (six) hours as needed.   OVER THE COUNTER MEDICATION Eye vitamin   potassium chloride SA 20 MEQ tablet Commonly known as: KLOR-CON M Take 20 mEq by mouth 2 (two) times daily.   Rosuvastatin Calcium 40 MG Cpsp Take 1 tablet by mouth daily.   spironolactone 25  MG tablet Commonly known as: ALDACTONE Take 25 mg by mouth daily.        LABORATORY STUDIES CBC    Component Value Date/Time   WBC 6.6 11/18/2021 1009   RBC 5.54 11/18/2021 1009   HGB 16.3 11/18/2021 1022   HCT 48.0 11/18/2021 1022   PLT 239 11/18/2021 1009   MCV 89.5 11/18/2021 1009   MCH 30.0 11/18/2021 1009   MCHC 33.5 11/18/2021 1009   RDW 12.3 11/18/2021 1009   LYMPHSABS 1.2 11/18/2021 1009   MONOABS 0.4 11/18/2021 1009   EOSABS 0.3 11/18/2021 1009   BASOSABS 0.0 11/18/2021 1009   CMP    Component Value Date/Time   NA 138 11/18/2021 1022   K 4.1 11/18/2021 1022   CL 103 11/18/2021 1022   CO2 25 11/18/2021 1009   GLUCOSE 102 (H) 11/18/2021 1022   BUN 14 11/18/2021 1022   CREATININE 0.80 11/18/2021 1022   CALCIUM 8.8 (L) 11/18/2021 1009   PROT 6.8 11/18/2021 1009   ALBUMIN 3.7 11/18/2021 1009   AST 34 11/18/2021 1009   ALT 38 11/18/2021 1009   ALKPHOS 62 11/18/2021 1009   BILITOT 0.9 11/18/2021 1009   GFRNONAA >60 11/18/2021 1009   COAGS Lab Results  Component Value Date   INR 1.0 11/18/2021   Lipid PanelNo results  found for: CHOL, TRIG, HDL, CHOLHDL, VLDL, LDLCALC HgbA1C No results found for: HGBA1C Urinalysis    Component Value Date/Time   COLORURINE YELLOW 11/18/2021 1246   APPEARANCEUR CLEAR 11/18/2021 1246   LABSPEC 1.032 (H) 11/18/2021 1246   PHURINE 6.0 11/18/2021 1246   GLUCOSEU NEGATIVE 11/18/2021 1246   HGBUR SMALL (A) 11/18/2021 1246   BILIRUBINUR NEGATIVE 11/18/2021 1246   KETONESUR NEGATIVE 11/18/2021 1246   PROTEINUR NEGATIVE 11/18/2021 1246   NITRITE NEGATIVE 11/18/2021 1246   LEUKOCYTESUR NEGATIVE 11/18/2021 1246   Urine Drug Screen     Component Value Date/Time   LABOPIA NONE DETECTED 11/18/2021 1246   COCAINSCRNUR NONE DETECTED 11/18/2021 1246   LABBENZ NONE DETECTED 11/18/2021 1246   AMPHETMU NONE DETECTED 11/18/2021 1246   THCU NONE DETECTED 11/18/2021 1246   LABBARB NONE DETECTED 11/18/2021 1246    Alcohol Level     Component Value Date/Time   ETH <10 11/18/2021 1009     SIGNIFICANT DIAGNOSTIC STUDIES CT ANGIO HEAD NECK W WO CM  Result Date: 11/18/2021 CLINICAL DATA:  Stroke.  Left-sided weakness.  Headache EXAM: CT ANGIOGRAPHY HEAD AND NECK TECHNIQUE: Multidetector CT imaging of the head and neck was performed using the standard protocol during bolus administration of intravenous contrast. Multiplanar CT image reconstructions and MIPs were obtained to evaluate the vascular anatomy. Carotid stenosis measurements (when applicable) are obtained utilizing NASCET criteria, using the distal internal carotid diameter as the denominator. RADIATION DOSE REDUCTION: This exam was performed according to the departmental dose-optimization program which includes automated exposure control, adjustment of the mA and/or kV according to patient size and/or use of iterative reconstruction technique. CONTRAST:  75mL OMNIPAQUE IOHEXOL 350 MG/ML SOLN COMPARISON:  CT head 11/18/2021 FINDINGS: CTA NECK FINDINGS Aortic arch: Mild atherosclerotic calcification aortic arch and proximal great vessels. Proximal great vessels are sufficiently patent. Right carotid system: Atherosclerotic calcification right carotid bifurcation and proximal right internal carotid artery. 20% diameter stenosis proximal right internal carotid artery due to eccentric calcific plaque. Left carotid system: Atherosclerotic calcification left carotid bifurcation without significant stenosis Vertebral arteries: Calcific plaque and mild stenosis origin of right vertebral artery. Remainder of the right vertebral artery is patent to the skull base. Extensive calcification of the origin of the left vertebral artery. There is faint opacification of the vertebral artery above the stenosis which then occludes in the mid cervical spine. Left vertebral and artery reconstitutes at the C1 level. Moderate calcific stenosis at the foramen magnum. Skeleton: Mild degenerative change  cervical spine. No acute skeletal abnormality. Median sternotomy wires. Other neck: Negative for soft tissue mass or edema. Upper chest: Lung apices clear bilaterally. Review of the MIP images confirms the above findings CTA HEAD FINDINGS Anterior circulation: Atherosclerotic calcification of the cavernous carotid bilaterally with mild stenosis on the right. Anterior and middle cerebral arteries widely patent without stenosis or large vessel occlusion. Posterior circulation: Right vertebral dominant and widely patent. Moderate stenosis left vertebral artery at the skull base. Left vertebral artery is patent to the basilar. Basilar widely patent. Superior cerebellar and posterior cerebral arteries patent bilaterally. Patent posterior communicating artery bilaterally Venous sinuses: Normal venous enhancement. Anatomic variants: None Review of the MIP images confirms the above findings IMPRESSION: 1. Negative for intracranial large vessel occlusion 2. 20% diameter stenosis proximal right internal carotid artery. 3. Mild atherosclerotic calcification left carotid bifurcation without stenosis 4. Mild stenosis of the origin the right vertebral artery 5. 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 Electronically Signed   By: Marlan Palau M.D.   On: 11/18/2021 11:50   MR BRAIN WO CONTRAST  Result Date: 11/19/2021 CLINICAL DATA:  76 year old male code stroke presentation, left side weakness. EXAM: MRI HEAD WITHOUT CONTRAST TECHNIQUE: Multiplanar, multiecho pulse sequences of the brain and surrounding structures were obtained without intravenous contrast. COMPARISON:  CT head and CTA head and neck yesterday. FINDINGS: Brain: Encephalomalacia in the left occipital pole, hypodense in retrospect on  yesterday's plain head CT (series 7, image 15). Conspicuous associated hemosiderin on SWI (image 44), which likely explains increased trace diffusion signal along the margins (series 3, image 23), as the area appears facilitated on ADC. No restricted diffusion elsewhere. No midline shift, mass effect, evidence of mass lesion, ventriculomegaly, extra-axial collection or acute intracranial hemorrhage. Cervicomedullary junction and pituitary are within normal limits. There is also a small area of chronic encephalomalacia in the left cerebellum PICA territory. But no other cerebral cortical encephalomalacia or chronic cerebral blood products identified. Minimal for age nonspecific periventricular white matter T2 and FLAIR hyperintensity. Vascular: Major intracranial vascular flow voids are preserved, with evidence of severe distal left vertebral artery atherosclerosis as seen by CTA (series 6, image 6). Skull and upper cervical spine: Negative visible cervical spine. Visualized bone marrow signal is within normal limits. Sinuses/Orbits: Negative orbits. Trace paranasal sinus mucosal thickening. Other: Mastoids are clear. Visible internal auditory structures appear normal. IMPRESSION: 1. No convincing acute intracranial abnormality. 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 Small chronic left PICA territory infarct. 2. Evidence of the severe distal left vertebral artery atherosclerosis seen by CTA. Electronically Signed   By: Odessa Fleming M.D.   On: 11/19/2021 11:40   ECHOCARDIOGRAM COMPLETE  Result Date: 11/19/2021    ECHOCARDIOGRAM REPORT   Patient Name:   JOSHUA SOULIER Date of Exam: 11/19/2021 Medical Rec #:  [id number]          Height:       69.0 in Accession #:    [id number]         Weight:       198.9 lb Date of Birth:  03-25-1946         BSA:          2.061 m Patient Age:    75 years           BP:            117/66 mmHg Patient Gender: M                  HR:           62 bpm. Exam Location:  Inpatient Procedure: 2D Echo Indications:    stroke  History:        Patient has prior history of Echocardiogram examinations. Prior                 CABG; Risk Factors:Sleep Apnea.  Sonographer:    Delcie Roch RDCS Referring Phys: [id number] Missie Gehrig IMPRESSIONS  1. Left ventricular ejection fraction, by estimation, is 60 to 65%. The left ventricle has normal function. The left ventricle has no regional wall motion abnormalities. There is mild asymmetric left ventricular hypertrophy of the basal and septal segments. Left ventricular diastolic parameters were normal.  2. Right ventricular systolic function is normal. The right ventricular size is normal.  3. Left atrial size was mildly dilated.  4. The mitral valve is abnormal. Trivial mitral valve regurgitation. No evidence of mitral stenosis. Moderate mitral annular calcification.  5. The aortic valve is tricuspid. There is moderate calcification of the aortic valve. There is moderate thickening of the aortic valve. Aortic valve regurgitation is not visualized. Aortic valve sclerosis/calcification is present, without any evidence of aortic stenosis.  6. The inferior vena cava is normal in size with greater than 50% respiratory variability, suggesting right atrial pressure of 3 mmHg. FINDINGS  Left Ventricle: Left ventricular ejection fraction, by estimation, is 60 to 65%. The left ventricle has normal function. The left ventricle has no regional wall motion abnormalities. The left ventricular internal cavity size was normal in size. There is  mild asymmetric left ventricular hypertrophy of the basal and septal segments. Left ventricular diastolic parameters were normal. Right Ventricle: The right ventricular size is normal. No increase in right ventricular wall thickness. Right ventricular systolic function is normal. Left Atrium: Left atrial size was mildly dilated. Right  Atrium: Right atrial size was normal in size. Pericardium: There is no evidence of pericardial effusion. Mitral Valve: The mitral valve is abnormal. There is moderate thickening of the mitral valve leaflet(s). There is moderate calcification of the mitral valve leaflet(s). Moderate mitral annular calcification. Trivial mitral valve regurgitation. No evidence of mitral valve stenosis. Tricuspid Valve: The tricuspid valve is normal in structure. Tricuspid valve regurgitation is trivial. No evidence of tricuspid stenosis. Aortic Valve: The aortic valve is tricuspid. There is moderate calcification of the aortic valve. There is moderate thickening of the aortic valve. Aortic valve regurgitation is not visualized. Aortic valve sclerosis/calcification is present, without any  evidence of aortic stenosis. Pulmonic Valve: The pulmonic valve was normal in structure. Pulmonic valve regurgitation is not visualized. No evidence of pulmonic stenosis. Aorta: The aortic root is normal in size and structure. Venous: The inferior vena cava is normal in size with greater than 50% respiratory variability, suggesting right atrial pressure of 3 mmHg. IAS/Shunts: No atrial level shunt detected by color flow Doppler.  LEFT VENTRICLE PLAX 2D LVIDd:         4.70 cm     Diastology LVIDs:         3.10 cm     LV e' medial:    8.70 cm/s LV PW:         1.20 cm     LV E/e' medial:  10.7 LV IVS:        1.10 cm     LV e' lateral:   11.70 cm/s LVOT diam:     1.80 cm     LV E/e' lateral: 7.9 LV SV:         70 LV SV Index:   34 LVOT Area:     2.54 cm  LV Volumes (MOD) LV vol d, MOD A4C: 83.9 ml LV vol s, MOD A4C: 38.8 ml LV SV MOD A4C:     83.9 ml RIGHT VENTRICLE            IVC RV S prime:     9.46 cm/s  IVC diam: 1.70 cm TAPSE (M-mode): 1.4 cm LEFT ATRIUM           Index        RIGHT ATRIUM           Index LA diam:      4.10  cm 1.99 cm/m   RA Area:     16.40 cm LA Vol (A2C): 58.7 ml 28.48 ml/m  RA Volume:   42.20 ml  20.48 ml/m LA Vol (A4C):  69.9 ml 33.92 ml/m  AORTIC VALVE LVOT Vmax:   125.00 cm/s LVOT Vmean:  79.600 cm/s LVOT VTI:    0.277 m  AORTA Ao Root diam: 3.50 cm Ao Asc diam:  3.20 cm MITRAL VALVE MV Area (PHT): 2.62 cm     SHUNTS MV Decel Time: 289 msec     Systemic VTI:  0.28 m MV E velocity: 93.00 cm/s   Systemic Diam: 1.80 cm MV A velocity: 106.00 cm/s MV E/A ratio:  0.88 Charlton Haws MD Electronically signed by Charlton Haws MD Signature Date/Time: 11/19/2021/11:43:12 AM    Final    CT HEAD CODE STROKE WO CONTRAST  Result Date: 11/18/2021 CLINICAL DATA:  Code stroke.  76 year old male left side weakness. EXAM: CT HEAD WITHOUT CONTRAST TECHNIQUE: Contiguous axial images were obtained from the base of the skull through the vertex without intravenous contrast. RADIATION DOSE REDUCTION: This exam was performed according to the departmental dose-optimization program which includes automated exposure control, adjustment of the mA and/or kV according to patient size and/or use of iterative reconstruction technique. COMPARISON:  None. FINDINGS: Brain: Cerebral volume is within normal limits for age. No midline shift, ventriculomegaly, mass effect, evidence of mass lesion, intracranial hemorrhage or evidence of cortically based acute infarction. Gray-white matter differentiation appears symmetric and within normal limits for age. Vascular: Calcified atherosclerosis at the skull base. No suspicious intracranial vascular hyperdensity. Skull: Negative. Sinuses/Orbits: Visualized paranasal sinuses and mastoids are clear. Tympanic cavities are clear. Other: Leftward gaze deviation. Normal stylomastoid foramina. Visualized scalp soft tissues are within normal limits. ASPECTS [facility] Stroke Program Early CT Score) Total score (0-10 with 10 being normal): 10. IMPRESSION: Normal for age non contrast CT appearance of the brain. ASPECTS 10. Study discussed by telephone with Dr. Durwin Nora in the ED on 11/18/2021 at 10:13 . Electronically Signed   By: Odessa Fleming  M.D.   On: 11/18/2021 10:17      HISTORY OF PRESENT ILLNESS Mr. Cordarrius Coad is a 76 y.o. 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 He stated that he woke up at 7:30 AM 11/18/2021 with mild headache but no other difficulties.  He took his medication in the morning including baby aspirin, walked to the mailbox and came back for coffee.  He sat in chair around 8:30 AM starting feeling hot and flush, continue to have mild headache at right frontal, and left facial tingling and slurred speech.  He got up went to bathroom but felt off balance and was not able to get up and walk.  Wife called EMS and patient sent to ER for evaluation.  In ER his headache gradually resolved.  BP 170/80, glucose 89.  CT no acute abnormality.   Per patient and wife, he had a left Bell's palsy 1980s, currently has left eyelid smaller than right.  However wife stated that his left face seems droopier than normal.  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 He states that these symptoms fluctuate, but seem to have resolved completely after TNK.      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  ASPECTS  10.     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 MRI  No convincing acute intracranial abnormality. 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 Small chronic left PICA territory infarct. Recommend 30-day CardioNet monitoring as  outpatient to rule out A. fib LDL 51 HgbA1c pending, follow up outpatient VTE prophylaxis - SCDs Aspirin 81 mg BID prior to admission, now on aspirin 81 mg BID and clopidogrel 75 mg daily for 3 weeks and then back to ASA 81 bid.  Therapy recommendations: None Disposition:  D/c home   Hypertension Home meds:  Toprol-XL 100mg , Aldactone 25mg  Stable Long-term BP goal normotensive   Hyperlipidemia Home meds:  Rosuvastatin 40mg , Zetia 10mg , resumed in hospital LDL 51, goal < 70 Continue statin at discharge   Diabetes type II Controlled Home meds:  None  HgbA1c goal < 7.0 CBGs   Other Stroke Risk Factors Advanced Age >/= 54  Coronary artery disease s/p CABG Ocluar Migraines Obstructive sleep apnea, on CPAP at home   Other Active Problems Left Bell's Palsy   DISCHARGE EXAM Blood pressure 131/63, pulse 62, temperature 98 F (36.7 C), temperature source Oral, resp. rate 18, height 5\' 9"  (1.753 m), weight 90.2 kg, SpO2 97 %. Physical Exam  Constitutional: Appears well-developed and well-nourished.  Psych: Affect appropriate to situation Cardiovascular: Normal rate and regular rhythm.  Respiratory: Effort normal, non-labored breathing   Neuro: Mental Status: Patient is awake, alert, oriented to person, place, month, year, and situation. Patient is able to give a clear and coherent history. No signs of aphasia or neglect Cranial Nerves: II: Visual Fields are full. Pupils are equal, round, and reactive to light.   III,IV, VI: EOMI without ptosis or diploplia.  V: Facial sensation is symmetric to temperature VII: Left facial droop, left eyelid droop VIII: Hearing is intact to voice X: Palate elevates symmetrically XI: Shoulder shrug is symmetric. XII: Tongue protrudes midline without atrophy or fasciculations.  Motor: Tone is normal. Bulk is normal. 5/5 strength was present in all four extremities.  Sensory: Sensation is symmetric to light touch and temperature in the  arms and legs. No extinction to DSS present.  Deep Tendon Reflexes: 2+ and symmetric in the biceps and patellae.  Plantars: Toes are downgoing bilaterally.  Cerebellar: FNF and HKS are intact bilaterally   DISCHARGE PLAN Disposition:  Home ASA 81mg  BID and Plavix 75mg  daily for secondary stroke prevention for 3 weeks then Plavix alone. Ongoing stroke risk factor control by Primary Care Physician at time of discharge Follow-up PCP Hix, Maebelle Munroe, MD in 2 weeks. Follow-up in [facility] on 12/13/21.  35 minutes were spent preparing discharge.  Patient seen and examined by NP/APP with MD. MD to update note as needed.   Elmer Picker, DNP, FNP-BC [facility] Pager: [phone]  ATTENDING NOTE: I reviewed above note and agree with the assessment and plan. Pt was seen and examined.   Wife and daughter at bedside.  Patient sitting in chair, awake alert, stated that he felt great and has worked with PT/OT and no recommendations.  MRI negative for stroke, however again showed chronic left occipital infarct with hemorrhagic transformation.  CT repeat showed no  acute bleeding after TN K.  Episode concerning for left cerebellar TIA versus aborted stroke due to left VA occlusion/high-grade stenosis.  Recommend continue aspirin twice daily and add Plavix daily for 3 weeks.  Continue statin and Zetia.  Recommend 30-day CardioNet monitoring as outpatient to rule out A. fib given history of CAD status post CABG and chronic left occipital infarct.  He will follow with Ervin KnackMilliken NP on 12/13/2021, and he has his cardiologist [facility].  Patient is ready for discharge today.  For detailed assessment and plan, please refer to above as I have made changes wherever appropriate.   Marvel PlanJindong Daxx Tiggs, MD PhD Stroke Neurology 11/19/2021 8:37 PM

## 2021-11-20 LAB — HEMOGLOBIN A1C
Hgb A1c MFr Bld: 5.7 % — ABNORMAL HIGH (ref 4.8–5.6)
Mean Plasma Glucose: 117 mg/dL

## 2021-11-26 ENCOUNTER — Ambulatory Visit (INDEPENDENT_AMBULATORY_CARE_PROVIDER_SITE_OTHER): Payer: Medicare Other

## 2021-11-26 DIAGNOSIS — I639 Cerebral infarction, unspecified: Secondary | ICD-10-CM

## 2021-11-26 DIAGNOSIS — I4891 Unspecified atrial fibrillation: Secondary | ICD-10-CM

## 2021-11-26 DIAGNOSIS — I63342 Cerebral infarction due to thrombosis of left cerebellar artery: Secondary | ICD-10-CM

## 2021-12-13 ENCOUNTER — Ambulatory Visit: Payer: Medicare Other | Admitting: Adult Health

## 2021-12-13 ENCOUNTER — Encounter: Payer: Self-pay | Admitting: Adult Health

## 2021-12-13 VITALS — BP 126/75 | HR 57 | Ht 69.0 in | Wt 200.0 lb

## 2021-12-13 DIAGNOSIS — H5310 Unspecified subjective visual disturbances: Secondary | ICD-10-CM | POA: Diagnosis not present

## 2021-12-13 DIAGNOSIS — G4733 Obstructive sleep apnea (adult) (pediatric): Secondary | ICD-10-CM | POA: Diagnosis not present

## 2021-12-13 DIAGNOSIS — Z9989 Dependence on other enabling machines and devices: Secondary | ICD-10-CM

## 2021-12-13 DIAGNOSIS — G459 Transient cerebral ischemic attack, unspecified: Secondary | ICD-10-CM

## 2021-12-13 DIAGNOSIS — I6502 Occlusion and stenosis of left vertebral artery: Secondary | ICD-10-CM | POA: Diagnosis not present

## 2021-12-13 NOTE — Progress Notes (Signed)
PATIENT: Alejandro Sherman DOB: November 25, 1945  REASON FOR VISIT: follow up HISTORY FROM: patient  HISTORY OF PRESENT ILLNESS: Today 12/13/21 : Alejandro Sherman is a 76 year old male with a history of obstructive sleep apnea on CPAP.  His download is below.  Patient more recently went into the hospital on January 22 due to TIA.  He reports that he woke up at 7:30 AM with a mild headache.  Around 830 he started feeling hot and flush continue to have a mild headache but now with left facial tingling and slurred speech.  Once he got up to go to the bathroom he realized that he was off balance.  His wife called EMS.  He was given tPA.  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  Small chronic left PICA territory infarct  LDL 51-currently on  Rosuvatatin 40 mg and Zetia 10 mg. hemoglobin A1c 5.7.  Discharge from the hospital on aspirin 81 mg twice daily and Plavix 75 mg daily for 3 weeks.  ECHO: EF 60-65 %  Today patient reports that  12/13/20: Alejandro Sherman is a 76 year old male with a history of obstructive sleep apnea on CPAP and memory disturbance.  He returns today for follow-up.  He was sent for neuropsychological testing for memory disturbance.  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  Alejandro Sherman discussed recommendations with the patient.  The patient reports that he stopped Aricept.  He feels that his symptoms have remained stable.  He has a follow-up with Alejandro Sherman.  He reports that he uses his CPAP nightly.  Denies any new symptoms.  Returns today for follow-up.  Compliance Report Compliance Payor Standard Usage 01/15/2020 - 02/13/2020 Usage days 30/30 days (100%) >= 4 hours 30 days (100%) Average usage (days used) 6  hours 33 minutes   AirSense 10 AutoSet Serial number [id number] Mode AutoSet Min Pressure 5 cmH2O Max Pressure 13 cmH2O EPR Fulltime EPR level 1  Therapy Pressure - cmH2O Median: 7.7 95th percentile: 10.2 Maximum: 11.2 Leaks - L/min Median: 6.0 95th percentile: 22.8 Maximum: 32.4 Events per hour AI: 1.1 HI: 0.3 AHI: 1.4 Apnea Index Central: 0.7 Obstructive: 0.3 Unknown: 0.0  06/12/20: Alejandro Sherman is a 76 year old male with a history of obstructive sleep apnea on CPAP and memory disturbance.  He returns today to discuss his memory.  He reports that when he initially started Aricept he noticed a change in his memory and attentiveness.  He also reports that his wife noticed this as well.  He states that since then these benefits have leveled off.  He has noticed some changes in his mood and behavior especially towards his wife.  He also notices some forgetfulness and trouble with his attention.  He returns today for an evaluation.  HISTORY 12/13/19:   Alejandro Sherman is a 76 year old male with a history of obstructive sleep apnea on CPAP and memory disturbance.  He returns today for follow-up.  His download indicates that he use his machine 30 out of 30 days for compliance of 100%.  He uses machine greater than 4 hours each night.  On average he uses his machine 6 hours and 56 minutes.  His residual AHI is two on 5 to 13 cm of water with EPR of one.  His leak in the 95th percentile is 24.6 L/min.  He reports that the CPAP continues to work well for him.  He continues to notice some changes with his memory.  Reports that he can walk into a room and forget what he went in for.  He does state eventually it will come to him.  He does have hearing aids but reports that he does not wear them consistently now that we have to wear a mask.  He is able to complete all ADLs independently.  He manages his own finances.  Denies any trouble cooking.  He is currently retired and lives at home with his wife.  REVIEW OF  SYSTEMS: Out of a complete 14 system review of symptoms, the patient complains only of the following symptoms, and all other reviewed systems are negative.  ESS 5 FSS21  ALLERGIES: Allergies  Allergen Reactions   [place] Grass Extract Other (See Comments)    Sneezing, itchy watery eyes    HOME MEDICATIONS: Outpatient Medications Prior to Visit  Medication Sig Dispense Refill   aspirin 81 MG tablet Take 81 mg by mouth daily.     Cholecalciferol 50 MCG (2000 UT) TABS Take by mouth.     clopidogrel (PLAVIX) 75 MG tablet Take 1 tablet (75 mg total) by mouth daily. 30 tablet 1   ezetimibe (ZETIA) 10 MG tablet Take 10 mg by mouth daily.     fluticasone (FLONASE) 50 MCG/ACT nasal spray Place 2 sprays into both nostrils daily.     loratadine (CLARITIN) 10 MG tablet Take 10 mg by mouth.     meclizine (ANTIVERT) 12.5 MG tablet Take 12.5 mg by mouth 3 (three) times daily as needed.     mesalamine (LIALDA) 1.2 G EC tablet Take 1.2 g by mouth daily with breakfast.     metoprolol succinate (TOPROL-XL) 100 MG 24 hr tablet Take 100 mg by mouth.     nitroGLYCERIN (NITROSTAT) 0.4 MG SL tablet Place 1 tablet under the tongue every 5 (five) minutes as needed.     ondansetron (ZOFRAN) 4 MG tablet Take 4 mg by mouth every 6 (six) hours as needed.     OVER THE COUNTER MEDICATION Eye vitamin     potassium chloride SA (KLOR-CON) 20 MEQ tablet Take 20 mEq by mouth 2 (two) times daily.     Rosuvastatin Calcium 40 MG CPSP Take 1 tablet by mouth daily.     spironolactone (ALDACTONE) 25 MG tablet Take 25 mg by mouth daily.     No facility-administered medications prior to visit.    PAST MEDICAL HISTORY: Past Medical History:  Diagnosis Date   Bell's palsy    20 years ago, affecting the left facial nerve   CAD (coronary artery disease)    Edema    Essential hypertension, benign    History of Bell's palsy    Hyperlipidemia    Myocardial infarction ([place]) 10/28/1994   Obesity    OSA (obstructive sleep  apnea)    on CPAP,  12-19-05   Ulcerative colitis ([place])     PAST SURGICAL HISTORY: Past Surgical History:  Procedure Laterality Date   Ulcerative colitis  04/2010   Hospitalization    FAMILY HISTORY: Family History  Problem Relation Age of Onset   Kidney cancer Mother    Heart attack Father    Ovarian cancer Sister     SOCIAL HISTORY: Social History   Socioeconomic History   Marital status: Married    Spouse name: Olin Hauser  Number of children: 2   Years of education: 16   Highest education level: Not on file  Occupational History   Not on file  Tobacco Use   Smoking status: Former   Smokeless tobacco: Never  Substance and Sexual Activity   Alcohol use: Yes    Alcohol/week: 0.0 standard drinks    Comment: 2 glasses of wine- nightly   Drug use: No   Sexual activity: Not on file  Other Topics Concern   Not on file  Social History Narrative   Patient is married. Olin Hauser)   Patient has two adult children.   Patient is retired.   Patient has a college education.   Patient is right-handed.   Patient does not drink any caffeine.      Social Determinants of Health   Financial Resource Strain: Not on file  Food Insecurity: Not on file  Transportation Needs: Not on file  Physical Activity: Not on file  Stress: Not on file  Social Connections: Not on file  Intimate Partner Violence: Not on file      PHYSICAL EXAM  There were no vitals filed for this visit. There is no height or weight on file to calculate BMI.   MMSE - Mini Mental State Exam 06/12/2020 12/13/2019 12/16/2017  Orientation to time 5 5 5   Orientation to Place 5 5 4   Registration 3 3 3   Attention/ Calculation 5 4 5   Recall 3 3 3   Language- name 2 objects 2 2 2   Language- repeat 1 1 1   Language- follow 3 step command 3 1 3   Language- read & follow direction 1 1 1   Write a sentence 1 1 1   Copy design 0 1 1  Copy design-comments - named 15 animals -  Total score 29 27 29      Generalized:  Well developed, in no acute distress  Chest: Lungs clear to auscultation bilaterally  Neurological examination  Mentation: Alert oriented to time, place, history taking. Follows all commands speech and language fluent Cranial nerve II-XII: Extraocular movements were full, visual field were full on confrontational test Head turning and shoulder shrug  were normal and symmetric. Motor: The motor testing reveals 5 over 5 strength of all 4 extremities. Good symmetric motor tone is noted throughout.  Sensory: Sensory testing is intact to soft touch on all 4 extremities. No evidence of extinction is noted.  Gait and station: Gait is normal.    DIAGNOSTIC DATA (LABS, IMAGING, TESTING) - I reviewed patient records, labs, notes, testing and imaging myself where available.   Lab Results  Component Value Date   [id number] 12/13/2019   Lab Results  Component Value Date   TSH 2.390 12/13/2019      ASSESSMENT AND PLAN 76 y.o. year old male  has a past medical history of Bell's palsy, CAD (coronary artery disease), Edema, Essential hypertension, benign, History of Bell's palsy, Hyperlipidemia, Myocardial infarction ([place]) (10/28/1994), Obesity, OSA (obstructive sleep apnea), and Ulcerative colitis ([place]). 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 Erlinda Hong left vertebral artery stenosis further intervention is not needed we will continue to manage by taking aspirin and keep blood pressure in normal range  Right visual disturbance  Visual disturbances only in the right eye.  Discussed with Dr. Lubertha Basque not seem like this would be a residual  effect of his stroke or previous occipital infarct.  I will refer for evaluation by ophthalmology  Obstructive sleep apnea on CPAP  CPAP download shows good  compliance Good treatment of apnea Encouraged to use CPAP nightly and greater than 4 hours each night  Follow-up in 1 year or sooner if needed    Ward Givens, MSN, NP-C 12/13/2021, 12:48 PM [facility] [address] [place], [place] [address] [phone]

## 2021-12-20 ENCOUNTER — Telehealth: Payer: Self-pay | Admitting: Adult Health

## 2021-12-20 NOTE — Addendum Note (Signed)
Addended by: Enedina Finner on: 12/20/2021 01:01 PM   Modules accepted: Orders

## 2021-12-20 NOTE — Telephone Encounter (Signed)
Sent to Dr. Groat ph # [phone] 

## 2021-12-27 ENCOUNTER — Encounter: Payer: Self-pay | Admitting: Adult Health

## 2022-01-03 ENCOUNTER — Telehealth: Payer: Self-pay

## 2022-01-03 NOTE — Telephone Encounter (Signed)
Please let patient know that cardiac monitoring only showed NSR. Nothing further needed. ?

## 2022-01-03 NOTE — Telephone Encounter (Signed)
Received Cardiac Monitoring interpretation report.  ?Study highlights were: ?Normal Sinus Rhythm and no Atrial Fibrillation.   ?

## 2022-01-06 ENCOUNTER — Other Ambulatory Visit: Payer: Self-pay

## 2022-01-06 ENCOUNTER — Emergency Department (HOSPITAL_COMMUNITY): Payer: Medicare Other

## 2022-01-06 ENCOUNTER — Observation Stay (HOSPITAL_COMMUNITY)
Admission: EM | Admit: 2022-01-06 | Discharge: 2022-01-07 | Disposition: A | Discharge status: Home / Self Care | Payer: Medicare Other | Attending: Family Medicine | Admitting: Family Medicine

## 2022-01-06 ENCOUNTER — Encounter (HOSPITAL_COMMUNITY): Payer: Self-pay

## 2022-01-06 DIAGNOSIS — E782 Mixed hyperlipidemia: Secondary | ICD-10-CM | POA: Diagnosis not present

## 2022-01-06 DIAGNOSIS — Z87891 Personal history of nicotine dependence: Secondary | ICD-10-CM | POA: Diagnosis not present

## 2022-01-06 DIAGNOSIS — I2510 Atherosclerotic heart disease of native coronary artery without angina pectoris: Secondary | ICD-10-CM | POA: Diagnosis not present

## 2022-01-06 DIAGNOSIS — Z7982 Long term (current) use of aspirin: Secondary | ICD-10-CM | POA: Insufficient documentation

## 2022-01-06 DIAGNOSIS — Z20822 Contact with and (suspected) exposure to covid-19: Secondary | ICD-10-CM | POA: Insufficient documentation

## 2022-01-06 DIAGNOSIS — Z79899 Other long term (current) drug therapy: Secondary | ICD-10-CM | POA: Insufficient documentation

## 2022-01-06 DIAGNOSIS — Z9989 Dependence on other enabling machines and devices: Secondary | ICD-10-CM

## 2022-01-06 DIAGNOSIS — G4733 Obstructive sleep apnea (adult) (pediatric): Secondary | ICD-10-CM

## 2022-01-06 DIAGNOSIS — R29810 Facial weakness: Secondary | ICD-10-CM | POA: Diagnosis present

## 2022-01-06 DIAGNOSIS — R270 Ataxia, unspecified: Secondary | ICD-10-CM

## 2022-01-06 DIAGNOSIS — I10 Essential (primary) hypertension: Secondary | ICD-10-CM | POA: Diagnosis not present

## 2022-01-06 DIAGNOSIS — G459 Transient cerebral ischemic attack, unspecified: Secondary | ICD-10-CM | POA: Diagnosis not present

## 2022-01-06 DIAGNOSIS — Z8673 Personal history of transient ischemic attack (TIA), and cerebral infarction without residual deficits: Secondary | ICD-10-CM | POA: Diagnosis not present

## 2022-01-06 DIAGNOSIS — R4781 Slurred speech: Secondary | ICD-10-CM

## 2022-01-06 LAB — CBC
HCT: 52.3 % — ABNORMAL HIGH (ref 39.0–52.0)
Hemoglobin: 16.8 g/dL (ref 13.0–17.0)
MCH: 28.6 pg (ref 26.0–34.0)
MCHC: 32.1 g/dL (ref 30.0–36.0)
MCV: 88.9 fL (ref 80.0–100.0)
Platelets: 263 10*3/uL (ref 150–400)
RBC: 5.88 MIL/uL — ABNORMAL HIGH (ref 4.22–5.81)
RDW: 12.2 % (ref 11.5–15.5)
WBC: 8.1 10*3/uL (ref 4.0–10.5)
nRBC: 0 % (ref 0.0–0.2)

## 2022-01-06 LAB — COMPREHENSIVE METABOLIC PANEL
ALT: 49 U/L — ABNORMAL HIGH (ref 0–44)
AST: 33 U/L (ref 15–41)
Albumin: 4 g/dL (ref 3.5–5.0)
Alkaline Phosphatase: 67 U/L (ref 38–126)
Anion gap: 9 (ref 5–15)
BUN: 19 mg/dL (ref 8–23)
CO2: 28 mmol/L (ref 22–32)
Calcium: 9.3 mg/dL (ref 8.9–10.3)
Chloride: 102 mmol/L (ref 98–111)
Creatinine, Ser: 0.96 mg/dL (ref 0.61–1.24)
GFR, Estimated: >60 mL/min (ref >60)
Glucose, Bld: 108 mg/dL — ABNORMAL HIGH (ref 70–99)
Potassium: 4.1 mmol/L (ref 3.5–5.1)
Sodium: 139 mmol/L (ref 135–145)
Total Bilirubin: 0.6 mg/dL (ref 0.3–1.2)
Total Protein: 7.6 g/dL (ref 6.5–8.1)

## 2022-01-06 LAB — DIFFERENTIAL
Abs Immature Granulocytes: 0.02 10*3/uL (ref 0.00–0.07)
Basophils Absolute: 0.1 10*3/uL (ref 0.0–0.1)
Basophils Relative: 1 %
Eosinophils Absolute: 0.3 10*3/uL (ref 0.0–0.5)
Eosinophils Relative: 4 %
Immature Granulocytes: 0 %
Lymphocytes Relative: 25 %
Lymphs Abs: 2.1 10*3/uL (ref 0.7–4.0)
Monocytes Absolute: 0.6 10*3/uL (ref 0.1–1.0)
Monocytes Relative: 7 %
Neutro Abs: 5 10*3/uL (ref 1.7–7.7)
Neutrophils Relative %: 63 %

## 2022-01-06 LAB — APTT: aPTT: 26 seconds (ref 24–36)

## 2022-01-06 LAB — I-STAT CHEM 8, ED
BUN: 22 mg/dL (ref 8–23)
Calcium, Ion: 1.21 mmol/L (ref 1.15–1.40)
Chloride: 101 mmol/L (ref 98–111)
Creatinine, Ser: 1 mg/dL (ref 0.61–1.24)
Glucose, Bld: 105 mg/dL — ABNORMAL HIGH (ref 70–99)
HCT: 51 % (ref 39.0–52.0)
Hemoglobin: 17.3 g/dL — ABNORMAL HIGH (ref 13.0–17.0)
Potassium: 4.7 mmol/L (ref 3.5–5.1)
Sodium: 139 mmol/L (ref 135–145)
TCO2: 29 mmol/L (ref 22–32)

## 2022-01-06 LAB — PROTIME-INR
INR: 1 (ref 0.8–1.2)
Prothrombin Time: 13.3 seconds (ref 11.4–15.2)

## 2022-01-06 LAB — ETHANOL: Alcohol, Ethyl (B): <10 mg/dL (ref <10)

## 2022-01-06 MED ORDER — ASPIRIN 325 MG PO TABS
325.0000 mg | ORAL_TABLET | Freq: Once | ORAL | Status: AC
  Administered 2022-01-06: 325 mg via ORAL

## 2022-01-06 NOTE — ED Notes (Signed)
SPOK paged @ 2209 ?

## 2022-01-06 NOTE — ED Provider Notes (Signed)
[facility] EMERGENCY DEPARTMENT Provider Note   CSN: [id number] Arrival date & time: 01/06/22  2205     History  Chief Complaint  Patient presents with   Code Stroke    Kerby NoraBailey Ronald Reichard is a 76 y.o. male.  Brought in by EMS as a code stroke activation.  Approximately 6:30 PM while eating with his wife he experienced a brief episode of confusion.  Was back to baseline quickly.  At 9 PM he got up to go to the bathroom when it was noticed that he had some left facial droop slurred speech and some difficulty walking.  He also said he had a headache at that time and felt a little flushed although the headache has resolved here.  He said he had a similar event a few months ago and was at [facility].  He received TNK.  He was placed on Plavix and aspirin.  Denies any double vision blurry vision.  No chest pain or shortness of breath.  No recent falls.  History of Bell's palsy with some slight droop of his left eyelid at baseline.  The history is provided by the patient and the EMS personnel.  Cerebrovascular Accident This is a recurrent problem. The current episode started 1 to 2 hours ago. The problem occurs constantly. The problem has not changed since onset.Pertinent negatives include no chest pain, no abdominal pain, no headaches and no shortness of breath. The symptoms are aggravated by walking. Nothing relieves the symptoms. He has tried nothing for the symptoms. The treatment provided no relief.      Home Medications Prior to Admission medications   Medication Sig Start Date End Date Taking? Authorizing Provider  aspirin 81 MG tablet Take 81 mg by mouth daily.    [provider]  Cholecalciferol 50 MCG (2000 UT) TABS Take by mouth. 01/05/20   [provider]  clopidogrel (PLAVIX) 75 MG tablet Take 1 tablet (75 mg total) by mouth daily. 11/19/21   Elmer PickerShafer, Devon, NP  ezetimibe (ZETIA) 10 MG tablet Take 10 mg by mouth daily.    [provider]  fluticasone  (FLONASE) 50 MCG/ACT nasal spray Place 2 sprays into both nostrils daily.    [provider]  loratadine (CLARITIN) 10 MG tablet Take 10 mg by mouth.    [provider]  meclizine (ANTIVERT) 12.5 MG tablet Take 12.5 mg by mouth 3 (three) times daily as needed. 09/27/21   [provider]  mesalamine (LIALDA) 1.2 G EC tablet Take 1.2 g by mouth daily with breakfast.    [provider]  metoprolol succinate (TOPROL-XL) 100 MG 24 hr tablet Take 100 mg by mouth. 11/25/16   [provider]  nitroGLYCERIN (NITROSTAT) 0.4 MG SL tablet Place 1 tablet under the tongue every 5 (five) minutes as needed. 02/25/19   [provider]  ondansetron (ZOFRAN) 4 MG tablet Take 4 mg by mouth every 6 (six) hours as needed. 09/27/21   [provider]  OVER THE COUNTER MEDICATION Eye vitamin    [provider]  potassium chloride SA (KLOR-CON) 20 MEQ tablet Take 20 mEq by mouth 2 (two) times daily.    [provider]  Rosuvastatin Calcium 40 MG CPSP Take 1 tablet by mouth daily. 12/05/20   [provider]  spironolactone (ALDACTONE) 25 MG tablet Take 25 mg by mouth daily. 02/06/21   [provider]      Allergies    [place] grass extract    Review of  Systems   Review of Systems  Constitutional:  Negative for fever.  HENT:  Negative for sore throat.   Eyes:  Negative for visual disturbance.  Respiratory:  Negative for shortness of breath.   Cardiovascular:  Negative for chest pain.  Gastrointestinal:  Negative for abdominal pain.  Genitourinary:  Negative for dysuria.  Musculoskeletal:  Positive for gait problem.  Skin:  Negative for rash.  Neurological:  Positive for dizziness, facial asymmetry and speech difficulty. Negative for headaches.  Psychiatric/Behavioral:  Positive for confusion.    Physical Exam Updated Vital Signs BP 126/69 (BP Location: Right Arm)    Pulse 61    Temp 97.7 F (36.5 C) (Oral)    Resp 19     Ht 5\' 9"  (1.753 m)    Wt 90.2 kg    SpO2 97%    BMI 29.36 kg/m  Physical Exam Vitals and nursing note reviewed.  Constitutional:      General: He is not in acute distress.    Appearance: Normal appearance. He is well-developed.  HENT:     Head: Normocephalic and atraumatic.  Eyes:     Conjunctiva/sclera: Conjunctivae normal.  Cardiovascular:     Rate and Rhythm: Normal rate and regular rhythm.     Heart sounds: No murmur heard. Pulmonary:     Effort: Pulmonary effort is normal. No respiratory distress.     Breath sounds: Normal breath sounds.  Abdominal:     Palpations: Abdomen is soft.     Tenderness: There is no abdominal tenderness. There is no guarding or rebound.  Musculoskeletal:        General: No swelling.     Cervical back: Neck supple.  Skin:    General: Skin is warm and dry.     Capillary Refill: Capillary refill takes less than 2 seconds.  Neurological:     Mental Status: He is alert and oriented to person, place, and time.     Cranial Nerves: Cranial nerve deficit present.     Gait: Gait abnormal.     Comments: He has subtle left facial droop.  His speech does not seem slurred to me but the patient feels it is slightly slurred.  He is moving his upper and lower extremities without any difficulty.  EMS states he was very unsteady getting into the stretcher.  Psychiatric:        Mood and Affect: Mood normal.    ED Results / Procedures / Treatments   Labs (all labs ordered are listed, but only abnormal results are displayed) Labs Reviewed  CBC - Abnormal; Notable for the following components:      Result Value   RBC 5.88 (*)    HCT 52.3 (*)    All other components within normal limits  COMPREHENSIVE METABOLIC PANEL - Abnormal; Notable for the following components:   Glucose, Bld 108 (*)    ALT 49 (*)    All other components within normal limits  URINALYSIS, ROUTINE W REFLEX MICROSCOPIC - Abnormal; Notable for the following components:   Color, Urine STRAW  (*)    Hgb urine dipstick SMALL (*)    Bacteria, UA RARE (*)    All other components within normal limits  COMPREHENSIVE METABOLIC PANEL - Abnormal; Notable for the following components:   Glucose, Bld 100 (*)    Calcium 8.8 (*)    Total Protein 6.1 (*)    Albumin 3.2 (*)    All other components within normal limits  I-STAT CHEM 8, ED -  Abnormal; Notable for the following components:   Glucose, Bld 105 (*)    Hemoglobin 17.3 (*)    All other components within normal limits  RESP PANEL BY RT-PCR (FLU A&B, COVID) ARPGX2  ETHANOL  PROTIME-INR  APTT  DIFFERENTIAL  RAPID URINE DRUG SCREEN, HOSP PERFORMED  MAGNESIUM  PHOSPHORUS  CBC    EKG EKG Interpretation  Date/Time:  Sunday January 06 2022 22:09:05 EDT Ventricular Rate:  67 PR Interval:  204 QRS Duration: 91 QT Interval:  420 QTC Calculation: 444 R Axis:   -4 Text Interpretation: Sinus rhythm Low voltage, precordial leads Minimal ST elevation, inferior leads No significant change since prior 1/23 Confirmed by Meridee Score [id number]) on 01/06/2022 10:17:15 PM  Radiology CT HEAD CODE STROKE WO CONTRAST  Result Date: 01/06/2022 CLINICAL DATA:  Code stroke. Initial evaluation for neuro deficit, stroke suspected. EXAM: CT HEAD WITHOUT CONTRAST TECHNIQUE: Contiguous axial images were obtained from the base of the skull through the vertex without intravenous contrast. RADIATION DOSE REDUCTION: This exam was performed according to the departmental dose-optimization program which includes automated exposure control, adjustment of the mA and/or kV according to patient size and/or use of iterative reconstruction technique. COMPARISON:  Recent head CT and MRI from 11/19/2021. FINDINGS: Brain: Age-related cerebral atrophy. Small chronic left PCA distribution infarct again noted involving the left occipital lobe. No acute intracranial hemorrhage. No acute large vessel territory infarct. No mass lesion or midline shift. No hydrocephalus or  extra-axial fluid collection. Vascular: No hyperdense vessel. Calcified atherosclerosis present at skull base. Skull: Scalp soft tissues and calvarium within normal limits. Sinuses/Orbits: Globes orbital soft tissues demonstrate no acute finding. Mild mucosal thickening noted within the ethmoidal air cells. Paranasal sinuses are otherwise clear. No mastoid effusion. Other: None. ASPECTS [facility] Stroke Program Early CT Score) - Ganglionic level infarction (caudate, lentiform nuclei, internal capsule, insula, M1-M3 cortex): 7 - Supraganglionic infarction (M4-M6 cortex): 3 Total score (0-10 with 10 being normal): 10 IMPRESSION: 1. Stable head CT.  No acute intracranial abnormality. 2. ASPECTS is 10. 3. Small remote left PCA distribution infarct. Results were called by telephone at the time of interpretation on 01/06/2022 at 10:31 pm to provider [facility] , who verbally acknowledged these results. Electronically Signed   By: Rise Mu M.D.   On: 01/06/2022 22:34    Procedures .Critical Care Performed by: Terrilee Files, MD Authorized by: Terrilee Files, MD   Critical care provider statement:    Critical care time (minutes):  45   Critical care time was exclusive of:  Separately billable procedures and treating other patients   Critical care was necessary to treat or prevent imminent or life-threatening deterioration of the following conditions:  CNS failure or compromise   Critical care was time spent personally by me on the following activities:  Development of treatment plan with patient or surrogate, discussions with consultants, evaluation of patient's response to treatment, examination of patient, obtaining history from patient or surrogate, ordering and performing treatments and interventions, ordering and review of laboratory studies, ordering and review of radiographic studies, pulse oximetry, re-evaluation of patient's condition and review of old charts   I assumed direction of  critical care for this patient from another provider in my specialty: no      Medications Ordered in ED Medications  aspirin tablet 325 mg (325 mg Oral Given 01/06/22 2256)   stroke: mapping our early stages of recovery book ( Does not apply Given 01/07/22 0618)    ED Course/ Medical  Decision Making/ A&P Clinical Course as of 01/07/22 1015  Wynelle Link Jan 06, 2022  2237 Received a call from radiology that the MRI is negative. [MB]  2249 Neurology is recommending admission to the hospital.  325 aspirin.  Will need an MRI brain and an EEG.  Also consider this could just be a complicated headache.  Patient agreeable to plan for admission. [MB]    Clinical Course User Index [MB] Terrilee Files, MD                           Medical Decision Making Amount and/or Complexity of Data Reviewed Labs: ordered. Radiology: ordered.  Risk OTC drugs. Decision regarding hospitalization.  Rebekah Sprinkle was evaluated in Emergency Department on 01/06/2022 for the symptoms described in the history of present illness. He was evaluated in the context of the global COVID-19 pandemic, which necessitated consideration that the patient might be at risk for infection with the SARS-CoV-2 virus that causes COVID-19. Institutional protocols and algorithms that pertain to the evaluation of patients at risk for COVID-19 are in a state of rapid change based on information released by regulatory bodies including the CDC and federal and state organizations. These policies and algorithms were followed during the patient's care in the ED. This patient complains of left facial droop slurred speech ataxia headache; this involves an extensive number of treatment Options and is a complaint that carries with it a high risk of complications and morbidity. The differential includes but not inclusive of stroke, bleed, TIA, seizure, metabolic derangement, arrhythmia  I ordered, reviewed and interpreted labs, which included CBC with  normal white count normal hemoglobin, chemistries normal, urinalysis without signs of infection, talk screen negative, COVID and flu negative I ordered medication oral aspirin and reviewed PMP when indicated. I ordered imaging studies which included CT head and I independently    visualized and interpreted imaging which showed no acute findings Additional history obtained from EMS and patient's wife Previous records obtained and reviewed in epic including prior admission for stroke TIA I consulted teleneurology and Dr. Thomes Dinning [facility] hospital[facility] and discussed lab and imaging findings and discussed disposition.  Cardiac monitoring reviewed, normal sinus rhythm Social determinants considered, independent barriers Critical Interventions: Evaluation of acute neurologic condition within the thrombolytic window  After the interventions stated above, I reevaluated the patient and found patient symptoms to be improving Admission and further testing considered, patient need to be admitted to the hospital for further work-up including MRI and EEG.  Patient in agreement with [facility].          Final Clinical Impression(s) / ED Diagnoses Final diagnoses:  Facial droop  Slurred speech  Ataxia    Rx / DC Orders ED Discharge Orders     None         Terrilee Files, MD 01/07/22 1019

## 2022-01-06 NOTE — ED Triage Notes (Signed)
Pt BIB [facility], LKW [id number], EMS states wife said pt became confused but resolved quickly. Then at 2100 pt began having slurred speech and left sided facial droop.  ? ?CBG 83 in triage ?

## 2022-01-06 NOTE — Consult Note (Signed)
[facility] ?[facility] Tele[facility]urology Consult Services ? ? ?Patient Name:   Alejandro Sherman, Alejandro Sherman ?Date of Birth:   03-14-1946 ?Identification Number:   MRN - [id number] ?Date of Service:   01/06/2022 22:24:36 ? ?Diagnosis: ?      G93.49 - Encephalopathy Multifactorial ? ?Impression: ?     3M presents with recurrent dizziness, headache and AMS. Symptoms have resolved. Hx vertiginous migraines, MRI neg CVA vs TIA noted in Jan. Not a thrombolytic candidate 2/2 resolved symptoms. Not an IR candidate 2/2 NIH low (no newly debilitating symptoms), CTA can be done routinely. Rec toxic metabolic infectious workup (U/A and UDS) and admission for Mri brain wo and EEG and CTA H&N. May not need repeat TTE. Differential includes complicated headache syndrome, sz or TIA or reactivation of prior CVA symptoms. ? ?Our recommendations are outlined below. ? ?Recommendations: ? ?      Stroke/Telemetry Floor ?      Neuro Checks ?      Bedside Swallow Eval ?      DVT Prophylaxis ?      IV Fluids, Normal Saline ?      Head of Bed 30 Degrees ?      Euglycemia and Avoid Hyperthermia (PRN Acetaminophen) ?      Initiate or continue Aspirin 325 MG daily ?      Antihypertensives PRN if Blood pressure is greater than 220/120 or there is a concern for End organ damage/contraindications for permissive HTN. If blood pressure is greater than 220/120 give labetalol PO or IV or Vasotec IV with a goal of 15% reduction in BP during the first 24 hours. ? ?Routine Consultation with [facility] for Follow up Care ? ?Sign Out: ?      Discussed with Emergency Department Provider ? ? ? ?------------------------------------------------------------------------------ ? ?Advanced Imaging: ?Advanced Imaging Not Completed because: ? ?NIH low (no newly debilitating symptoms), CTA can be done routinely ? ? ?Metrics: ?Last Known Well: 01/06/2022 19:30:00 ?[facility] Notification Time: 01/06/2022 22:24:36 ?Arrival Time: 01/06/2022 22:05:00 ?Stamp Time:  01/06/2022 22:24:36 ?Initial Response Time: 01/06/2022 22:30:39 ?Symptoms: AMS. ?NIHSS Start Assessment Time: 01/06/2022 22:34:00 ?Patient is not a candidate for Thrombolytic. ?Thrombolytic Medical Decision: 01/06/2022 22:35:00 ?Patient was not deemed candidate for Thrombolytic because of following reasons: ?Resolved symptoms (no residual disabling symptoms). ? ?I personally Reviewed the CT Head and it Showed no acute pathology ? ?ED Physician notified of diagnostic impression and management plan on 01/06/2022 22:50:47 ? ? ? ?------------------------------------------------------------------------------ ? ?History of Present Illness: ?Patient is a 76 year old Male. ? ?Patient was brought by EMS for symptoms of AMS. ?3M 1845 eating dinner and was ok. at 18:30 had AMS (confusion about app for [facility]) for 1-2 min then at 2100 got up 2/2 sudden urge to go to restroom. LKN 7:30 when wife went upstairs. Then he had vertigo and dizziness and had L face droop and L eye droop and slurred speech. Hx vertiginous migraines. Had a headache today when this happened. Headache was a 6/10. Headache is gone now (he took 2 ASA when the headache started). ? ?No No hx recent illness (SOB, fever, chills, etc). No new meds ? ?Jan 22 got TNK for CVA and MRI brain was neg for CVA (had simular episode but tonight not as bad). ? ? ?Past Medical History: ?     Stroke ?     There is no history of Seizures ?Othere PMH:  L PCA CVA, OSA on CPAP ? ?Medications: ? ?No Anticoagulant use  ?Antiplatelet use:  Yes ASA 81 mg bid ?Reviewed EMR for current medications ? ?Allergies:  ?Reviewed ? ?Social History: ?Patient Is: Married ?Smoking: Former ?Alcohol Use: No ?Drug Use: No ? ?Family History: ? ?There is no family history of premature cerebrovascular disease pertinent to this consultation ? ?ROS : ?14 Points Review of Systems was performed and was negative except mentioned in HPI. ? ?Past Surgical History: ?There Is No Surgical History  Contributory To Today?s Visit ? ? ? ?Examination: ?BP(132/84), Pulse(67), Blood Glucose(105) ?1A: Level of Consciousness - Alert; keenly responsive + 0 ?1B: Ask Month and Age - Both Questions Right + 0 ?1C: Blink Eyes & Squeeze Hands - Performs Both Tasks + 0 ?2: Test Horizontal Extraocular Movements - Normal + 0 ?3: Test Visual Fields - No Visual Loss + 0 ?4: Test Facial Palsy (Use Grimace if Obtunded) - Minor paralysis (flat nasolabial fold, smile asymmetry) + 1 ?5A: Test Left Arm Motor Drift - No Drift for 10 Seconds + 0 ?5B: Test Right Arm Motor Drift - No Drift for 10 Seconds + 0 ?6A: Test Left Leg Motor Drift - No Drift for 5 Seconds + 0 ?6B: Test Right Leg Motor Drift - No Drift for 5 Seconds + 0 ?7: Test Limb Ataxia (FNF/Heel-Shin) - No Ataxia + 0 ?8: Test Sensation - Normal; No sensory loss + 0 ?9: Test Language/Aphasia - Normal; No aphasia + 0 ?10: Test Dysarthria - Normal + 0 ?11: Test Extinction/Inattention - No abnormality + 0 ? ?NIHSS Score: 1 ? ?NIHSS Free Text : hx L Bell's (chronic L face weakness) ? ?Pre-Morbid Modified Rankin Scale: ?0 Points = No symptoms at all ? ? ?Patient/Family was informed the Neurology Consult would occur via TeleHealth consult by way of interactive audio and video telecommunications and consented to receiving care in this manner. ? ? ?Patient is being evaluated for possible acute neurologic impairment and high probability of imminent or life-threatening deterioration. I spent total of 26 minutes providing care to this patient, including time for face to face visit via telemedicine, review of medical records, imaging studies and discussion of findings with providers, the patient and/or family. ? ? ?Dr Deitra Mayo ? ? ?[facility] ?[phone] ? ? ?Case [id number] ?

## 2022-01-06 NOTE — Progress Notes (Signed)
Code Stroke Activated @2206 . mRS 0, Left facial & left eye droop chronic from Bell's Palsy, vertigo and slurred speech. Patient left for CT @2217  and returned to room @2224 . Neurologist paged @2224 . Dr. on Stroke cart for stroke evaluation @2230 . NIH1. NCCT Impression: ?1. Stable head CT.  No acute intracranial abnormality. ?2. ASPECTS is 10. ?3. Small remote left PCA distribution infarct. ?Results given to Dr. on camera @2231 .  ?  ?

## 2022-01-06 NOTE — H&P (Addendum)
?History and Physical  ? ? ?Patient: Alejandro Sherman [id number] DOB: [birth year] ?DOA: 01/06/2022 ?DOS: the patient was seen and examined on 01/07/2022 ?PCP: Hix, Jonna Munro, MD  ?Patient coming from: Home ? ?Chief Complaint:  ?Chief Complaint  ?Patient presents with  ? Code Stroke  ? ?HPI: Alejandro Sherman is a 76 y.o. 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  Patient states that he got up to use the bathroom when he noted a left facial droop and slurred speech as well as difficulty in walking.  He endorsed feeling flushed and having headache around the same time and since symptoms with similar to symptoms he had in January when he was diagnosed to have small posterior circulation infarct with ataxia, he states that he went to take some baby aspirin in and is ready for improved symptoms.  ED notified wife and it was decided to activate EMS.  Patient was brought to the ED for further evaluation and management.  While in the ED, symptoms resolved and was back to his baseline. ? ?ED course: ?In the emergency department, he was intermittently bradycardic, other vital signs were within normal range.  Work-up in the ED showed normal CBC and BMP, urinalysis was negative, urine drug screen was negative, alcohol level was less than 10.  Influenza A, B, SARS coronavirus 2 was negative. ?CT head without contrast showed no acute intracranial abnormality ?Telemetry neurologist was consulted and recommended further stroke work-up.  Hospitalist was asked to admit patient for further evaluation and management. ?  ?Review of Systems: As mentioned in the history of present illness. All other systems reviewed and are negative. ? ?Past Medical History:  ?Diagnosis Date  ? Bell's palsy   ? 20 years ago,  affecting the left facial nerve  ? CAD (coronary artery disease)   ? Edema   ? Essential hypertension, benign   ? History of Bell's palsy   ? Hyperlipidemia   ? Myocardial infarction ([place]) 10/28/1994  ? Obesity   ? OSA (obstructive sleep apnea)   ? on CPAP,  12-19-05  ? Ulcerative colitis ([place])   ? ?Past Surgical History:  ?Procedure Laterality Date  ? Ulcerative colitis  04/2010  ? Hospitalization  ? ?Social History:  reports that he has quit smoking. He has never used smokeless tobacco. He reports that he does not currently use alcohol after a past usage of about 3.0 standard drinks per week. He reports that he does not use drugs. ? ?Allergies  ?Allergen Reactions  ? [place] Grass Extract Other (See Comments)  ?  Sneezing, itchy watery eyes  ? ? ?Family History  ?Problem Relation Age of Onset  ? Kidney cancer Mother   ? Stroke Mother   ? Heart attack Father   ? Ovarian cancer Sister   ? Sleep apnea Neg Hx   ? ? ?Prior to Admission medications   ?Medication Sig Start Date End Date Taking? Authorizing Provider  ?aspirin 81 MG tablet Take 81 mg by mouth daily.    [provider]  ?Cholecalciferol 50 MCG (2000 UT) TABS Take by mouth. 01/05/20   [provider]  ?clopidogrel (PLAVIX) 75 MG tablet Take 1 tablet (75 mg total) by mouth daily. 11/19/21   Janine Ores, NP  ?ezetimibe (ZETIA) 10 MG tablet Take 10 mg by mouth  daily.    [provider]  ?fluticasone (FLONASE) 50 MCG/ACT nasal spray Place 2 sprays into both nostrils daily.    [provider]  ?loratadine (CLARITIN) 10 MG tablet Take 10 mg by mouth.    [provider]  ?meclizine (ANTIVERT) 12.5 MG tablet Take 12.5 mg by mouth 3 (three) times daily as needed. 09/27/21   [provider]  ?mesalamine (LIALDA) 1.2 G EC tablet Take 1.2 g by mouth daily with breakfast.    [provider]  ?metoprolol succinate (TOPROL-XL) 100 MG 24 hr tablet Take 100 mg by mouth. 11/25/16   [provider]   ?nitroGLYCERIN (NITROSTAT) 0.4 MG SL tablet Place 1 tablet under the tongue every 5 (five) minutes as needed. 02/25/19   [provider]  ?ondansetron (ZOFRAN) 4 MG tablet Take 4 mg by mouth every 6 (six) hours as needed. 09/27/21   [provider]  ?[place] vitamin    [provider]  ?potassium chloride SA (KLOR-CON) 20 MEQ tablet Take 20 mEq by mouth 2 (two) times daily.    [provider]  ?Rosuvastatin Calcium 40 MG CPSP Take 1 tablet by mouth daily. 12/05/20   [provider]  ?spironolactone (ALDACTONE) 25 MG tablet Take 25 mg by mouth daily. 02/06/21   [provider]  ? ? ?Physical Exam: ?Vitals:  ? 01/06/22 2300 01/06/22 2300 01/07/22 0100 01/07/22 0132  ?BP: 129/76  136/66 (!) 145/71  ?Pulse: 62   (!) 58  ?Resp: 20  15 16   ?Temp:  98 ?F (36.7 ?C) 98.3 ?F (36.8 ?C) 97.8 ?F (36.6 ?C)  ?TempSrc:   Oral Oral  ?SpO2: 99%  98% 99%  ?Weight:      ?Height:      ? ?General: Elderly male. Awake and alert and oriented x3. Not in any acute distress.  ?HEENT: NCAT.  PERRLA. EOMI. Sclerae anicteric.  Moist mucosal membranes. ?Neck: Neck supple without lymphadenopathy. No carotid bruits. No masses palpated.  ?Cardiovascular: Regular rate with normal S1-S2 sounds. No murmurs, rubs or gallops auscultated. No JVD.  ?Respiratory: Clear breath sounds.  No accessory muscle use. ?Abdomen: Soft, nontender, nondistended. Active bowel sounds. No masses or hepatosplenomegaly  ?Skin: No rashes, lesions, or ulcerations.  Dry, warm to touch. ?Musculoskeletal:  2+ dorsalis pedis and radial pulses. Good ROM.  No contractures  ?Psychiatric: Intact judgment and insight.  Mood appropriate to current condition. ?Neurologic: No focal neurological deficits. Strength is 5/5 x 4.  CN II - XII grossly intact. NIHSS 0 ? ?Data Reviewed: ?EKG personally reviewed showed normal sinus rhythm at a rate of 67 bpm ? ?Assessment and Plan: ?* Transient ischemic attack (TIA) ?Patient  presents with left facial droop and slurred speech as well as ambulatory difficulty which has since resolved ?CT head without contrast showed no acute intracranial abnormality ?Patient will be admitted to telemetry unit  ?CT angiography of head and neck per teleneurology recommendation ?Echocardiogram in the morning ?MRI of brain without contrast in the morning per teleneurology recommendation ?EEG will be done in the morning per teleneurology recommendation ?Continue aspirin and statin ?Continue fall precautions and neuro checks ?Lipid panel and hemoglobin A1c will be checked ?Continue PT/OT eval and treat ?Bedside swallow eval by nursing prior to diet ?Tele neurology will be consulted to follow-up with patient in the morning.  ? ?Mixed hyperlipidemia ?Continue Crestor and Zetia ? ?Essential hypertension ? Antihypertensives PRN if Blood pressure is greater than 220/120 or there is a concern  for End organ damage/contraindications for permissive HTN. If blood pressure is greater than 220/120 give labetalol PO or IV or Vasotec IV with a goal of 15% reduction in BP during the first 24 hours. ? ? ?History of stroke ?Continue management as described for TIA ? ?OSA on CPAP ?Continue CPAP ? ? ? ? ? Advance Care Planning: CODE STATUS: Full ? ?Consults: Teleneurology ? ?Family Communication: None at bedside ? ?Severity of Illness: ?The appropriate patient status for this patient is OBSERVATION. Observation status is judged to be reasonable and necessary in order to provide the required intensity of service to ensure the patient's safety. The patient's presenting symptoms, physical exam findings, and initial radiographic and laboratory data in the context of their medical condition is felt to place them at decreased risk for further clinical deterioration. Furthermore, it is anticipated that the patient will be medically stable for discharge from the hospital within 2 midnights of admission.  ? ?Author: Bernadette Hoit,  DO ?01/07/2022 6:14 AM ? ?For on call review [email or web address].  ?

## 2022-01-07 ENCOUNTER — Observation Stay (HOSPITAL_COMMUNITY)
Admit: 2022-01-07 | Discharge: 2022-01-07 | Disposition: A | Discharge status: Home / Self Care | Payer: Medicare Other | Attending: Internal Medicine | Admitting: Internal Medicine

## 2022-01-07 ENCOUNTER — Observation Stay (HOSPITAL_BASED_OUTPATIENT_CLINIC_OR_DEPARTMENT_OTHER): Payer: Medicare Other

## 2022-01-07 ENCOUNTER — Observation Stay (HOSPITAL_COMMUNITY): Payer: Medicare Other

## 2022-01-07 ENCOUNTER — Other Ambulatory Visit (HOSPITAL_COMMUNITY): Payer: Self-pay | Admitting: *Deleted

## 2022-01-07 DIAGNOSIS — Z8673 Personal history of transient ischemic attack (TIA), and cerebral infarction without residual deficits: Secondary | ICD-10-CM

## 2022-01-07 DIAGNOSIS — R4182 Altered mental status, unspecified: Secondary | ICD-10-CM

## 2022-01-07 DIAGNOSIS — I10 Essential (primary) hypertension: Secondary | ICD-10-CM

## 2022-01-07 DIAGNOSIS — G459 Transient cerebral ischemic attack, unspecified: Secondary | ICD-10-CM | POA: Diagnosis not present

## 2022-01-07 DIAGNOSIS — E782 Mixed hyperlipidemia: Secondary | ICD-10-CM

## 2022-01-07 LAB — URINALYSIS, ROUTINE W REFLEX MICROSCOPIC
Bilirubin Urine: NEGATIVE
Glucose, UA: NEGATIVE mg/dL
Ketones, ur: NEGATIVE mg/dL
Leukocytes,Ua: NEGATIVE
Nitrite: NEGATIVE
Protein, ur: NEGATIVE mg/dL
Specific Gravity, Urine: 1.009 (ref 1.005–1.030)
pH: 6 (ref 5.0–8.0)

## 2022-01-07 LAB — COMPREHENSIVE METABOLIC PANEL
ALT: 37 U/L (ref 0–44)
AST: 24 U/L (ref 15–41)
Albumin: 3.2 g/dL — ABNORMAL LOW (ref 3.5–5.0)
Alkaline Phosphatase: 53 U/L (ref 38–126)
Anion gap: 7 (ref 5–15)
BUN: 17 mg/dL (ref 8–23)
CO2: 24 mmol/L (ref 22–32)
Calcium: 8.8 mg/dL — ABNORMAL LOW (ref 8.9–10.3)
Chloride: 106 mmol/L (ref 98–111)
Creatinine, Ser: 0.67 mg/dL (ref 0.61–1.24)
GFR, Estimated: >60 mL/min (ref >60)
Glucose, Bld: 100 mg/dL — ABNORMAL HIGH (ref 70–99)
Potassium: 3.9 mmol/L (ref 3.5–5.1)
Sodium: 137 mmol/L (ref 135–145)
Total Bilirubin: 0.6 mg/dL (ref 0.3–1.2)
Total Protein: 6.1 g/dL — ABNORMAL LOW (ref 6.5–8.1)

## 2022-01-07 LAB — CBC
HCT: 45.7 % (ref 39.0–52.0)
Hemoglobin: 14.7 g/dL (ref 13.0–17.0)
MCH: 28.5 pg (ref 26.0–34.0)
MCHC: 32.2 g/dL (ref 30.0–36.0)
MCV: 88.7 fL (ref 80.0–100.0)
Platelets: 229 10*3/uL (ref 150–400)
RBC: 5.15 MIL/uL (ref 4.22–5.81)
RDW: 12.1 % (ref 11.5–15.5)
WBC: 8 10*3/uL (ref 4.0–10.5)
nRBC: 0 % (ref 0.0–0.2)

## 2022-01-07 LAB — GLUCOSE, CAPILLARY: Glucose-Capillary: 83 mg/dL (ref 70–99)

## 2022-01-07 LAB — RESP PANEL BY RT-PCR (FLU A&B, COVID) ARPGX2
Influenza A by PCR: NEGATIVE
Influenza B by PCR: NEGATIVE
SARS Coronavirus 2 by RT PCR: NEGATIVE

## 2022-01-07 LAB — RAPID URINE DRUG SCREEN, HOSP PERFORMED
Amphetamines: NOT DETECTED
Barbiturates: NOT DETECTED
Benzodiazepines: NOT DETECTED
Cocaine: NOT DETECTED
Opiates: NOT DETECTED
Tetrahydrocannabinol: NOT DETECTED

## 2022-01-07 LAB — ECHOCARDIOGRAM LIMITED
Height: 69 in
S' Lateral: 3 cm
Weight: 3180.8 oz

## 2022-01-07 LAB — MAGNESIUM: Magnesium: 2.2 mg/dL (ref 1.7–2.4)

## 2022-01-07 LAB — PHOSPHORUS: Phosphorus: 3.4 mg/dL (ref 2.5–4.6)

## 2022-01-07 MED ORDER — SPIRONOLACTONE 25 MG PO TABS: 25.0000 mg | ORAL_TABLET | Freq: Every day | ORAL | 3 refills | Status: AC

## 2022-01-07 MED ORDER — PERFLUTREN LIPID MICROSPHERE
1.0000 mL | INTRAVENOUS | Status: AC | PRN
  Administered 2022-01-07: 3 mL via INTRAVENOUS
  Filled 2022-01-07: qty 10

## 2022-01-07 MED ORDER — ROSUVASTATIN CALCIUM 20 MG PO TABS
40.0000 mg | ORAL_TABLET | Freq: Every day | ORAL | Status: DC
  Administered 2022-01-07: 40 mg via ORAL
  Filled 2022-01-07: qty 2

## 2022-01-07 MED ORDER — ASPIRIN EC 81 MG PO TBEC: 81.0000 mg | DELAYED_RELEASE_TABLET | Freq: Every day | ORAL | 3 refills | Status: AC

## 2022-01-07 MED ORDER — ASPIRIN 81 MG PO CHEW
81.0000 mg | CHEWABLE_TABLET | Freq: Every day | ORAL | Status: DC
  Administered 2022-01-07: 81 mg via ORAL
  Filled 2022-01-07: qty 1

## 2022-01-07 MED ORDER — ROSUVASTATIN CALCIUM 40 MG PO CPSP: 1.0000 | ORAL_CAPSULE | Freq: Every day | ORAL | 3 refills | Status: AC

## 2022-01-07 MED ORDER — STROKE: EARLY STAGES OF RECOVERY BOOK: Freq: Once | Status: AC

## 2022-01-07 MED ORDER — METOPROLOL SUCCINATE ER 50 MG PO TB24: 50.0000 mg | ORAL_TABLET | Freq: Every day | ORAL | 3 refills | Status: AC

## 2022-01-07 MED ORDER — IOHEXOL 350 MG/ML SOLN
100.0000 mL | Freq: Once | INTRAVENOUS | Status: AC | PRN
  Administered 2022-01-07: 75 mL via INTRAVENOUS

## 2022-01-07 MED ORDER — EZETIMIBE 10 MG PO TABS
10.0000 mg | ORAL_TABLET | Freq: Every day | ORAL | Status: DC
  Administered 2022-01-07: 10 mg via ORAL
  Filled 2022-01-07: qty 1

## 2022-01-07 NOTE — TOC Transition Note (Signed)
? ?  Transition of Care (TOC) Screening Note ? ? ?Patient Details  ?Name: Alejandro Sherman ?Date of Birth: 1945-11-20 ? ? ?Transition of Care (TOC) CM/SW Contact:    ?Leitha Bleak, RN ?Phone Number: ?01/07/2022, 12:45 PM ? ? ? ?Transition of Care Department [facility]) has reviewed patient and no TOC needs have been identified at this time. We will continue to monitor patient advancement through interdisciplinary progression rounds. If new patient transition needs arise, please place a TOC consult. ? ? ? ? ?  ?Barriers to Discharge: No Barriers Identified ? ?  ?

## 2022-01-07 NOTE — Progress Notes (Signed)
OT Cancellation Note ? ?Patient Details ?Name: Alejandro Sherman ?MRN: [id number] ?DOB: 05-01-1946 ? ? ?Cancelled Treatment:    Reason Eval/Treat Not Completed: OT screened, no needs identified, will sign off. Pt performing mobility and ADLs independently, no vision changes at this time. No further OT services required.  ? ?[name], OTR/L  ?[phone] ?01/07/2022, 8:36 AM ?

## 2022-01-07 NOTE — Progress Notes (Signed)
Patient having a 2D-ECHO,unable to do NIH vital signs and assessment. ?

## 2022-01-07 NOTE — Progress Notes (Signed)
*  PRELIMINARY RESULTS* ?Echocardiogram ?Limited 2-D Echocardiogram  has been performed with Definity. ? ?[name] ?01/07/2022, 2:02 PM ?

## 2022-01-07 NOTE — Discharge Instructions (Signed)
1) your metoprolol/Toprol-XL medication has been decreased to 50 mg daily from 100 mg daily due to concerns about low blood pressure and slow heart rate ? ?2) take aspirin and Crestor as prescribed for stroke prevention ? ?3)follow up with the stroke neurologist Dr. Garner Nash 4 to 6 weeks for recheck and reevaluation ? ?4) avoid prolonged fasting and low blood sugars ? ?5) consider referral to ophthalmologist/eye doctor if your visual disturbance persist ? ?6)Avoid ibuprofen/Advil/Aleve/Motrin/Goody Powders/Naproxen/BC powders/Meloxicam/Diclofenac/Indomethacin and other Nonsteroidal anti-inflammatory medications as these will make you more likely to bleed and can cause stomach ulcers, can also cause Kidney problems.  ?

## 2022-01-07 NOTE — Progress Notes (Signed)
2210 call time ?2210 beeper time ?2220 exam started ?2221 exam finished ?2222 images sent to soc ?2222 exam completed in epic ?2222 [facility] called ? ?

## 2022-01-07 NOTE — Progress Notes (Signed)
EEG completed, results pending. 

## 2022-01-07 NOTE — Evaluation (Signed)
Physical Therapy Evaluation ?Patient Details ?Name: Alejandro Sherman ?MRN: [id number] ?DOB: October 16, 1946 ?Today's Date: 01/07/2022 ? ?History of Present Illness ? Alejandro Sherman is a 76 y.o. 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  Patient states that he got up to use the bathroom when he noted a left facial droop and slurred speech as well as difficulty in walking.  He endorsed feeling flushed and having headache around the same time and since symptoms with similar to symptoms he had in January when he was diagnosed to have small posterior circulation infarct with ataxia, he states that he went to take some baby aspirin in and is ready for improved symptoms.  ED notified wife and it was decided to activate EMS.  Patient was brought to the ED for further evaluation and management.  While in the ED, symptoms resolved and was back to his baseline. ?  ?Clinical Impression ? Patient functioning at baseline for functional mobility and gait demonstrating good return for ambulating in room and hallways without loss of balance.  Plan:  Patient discharged from physical therapy to care of nursing for ambulation daily as tolerated for length of stay.  ?   ?   ? ?Recommendations for follow up therapy are one component of a multi-disciplinary discharge planning process, led by the attending physician.  Recommendations may be updated based on patient status, additional functional criteria and insurance authorization. ? ?Follow Up Recommendations No PT follow up ? ?  ?Assistance Recommended at Discharge PRN  ?Patient can return home with the following ? Other (comment) (patient at baseline) ? ?  ?Equipment Recommendations None recommended by PT  ?Recommendations for Other Services ?    ?  ?Functional  Status Assessment Patient has not had a recent decline in their functional status  ? ?  ?Precautions / Restrictions Precautions ?Precautions: None ?Restrictions ?Weight Bearing Restrictions: No  ? ?  ? ?Mobility ? Bed Mobility ?Overal bed mobility: Independent ?  ?  ?  ?  ?  ?  ?  ?  ? ?Transfers ?Overall transfer level: Independent ?  ?  ?  ?  ?  ?  ?  ?  ?  ?  ? ?Ambulation/Gait ?Ambulation/Gait assistance: Modified independent (Device/Increase time) ?Gait Distance (Feet): 200 Feet ?Assistive device: None ?Gait Pattern/deviations: WFL(Within Functional Limits) ?Gait velocity: decreased ?  ?  ?General Gait Details: grossly WFL with slightly labored cadence with good return for ambulation on level, inclined and declined surfaces without loss of balance ? ?Stairs ?  ?  ?  ?  ?  ? ?Wheelchair Mobility ?  ? ?Modified Rankin (Stroke Patients Only) ?  ? ?  ? ?Balance Overall balance assessment: No apparent balance deficits (not formally assessed) ?  ?  ?  ?  ?  ?  ?  ?  ?  ?  ?  ?  ?  ?  ?  ?  ?  ?  ?   ? ? ? ?Pertinent Vitals/Pain Pain Assessment ?Pain Assessment: No/denies pain  ? ? ?Home Living Family/patient expects to be discharged to:: Private residence ?Living Arrangements: Spouse/significant other ?Available Help at Discharge: Family;Available 24 hours/day ?Type of Home: House ?Home Access: Stairs to enter ?Entrance Stairs-Rails: Right ?Entrance Stairs-Number of Steps: 3 ?Alternate Level Stairs-Number of Steps: flight ?Home Layout: Two  level;Able to live on main level with bedroom/bathroom ?Home Equipment: None ?   ?  ?Prior Function Prior Level of Function : Independent/Modified Independent;Driving ?  ?  ?  ?  ?  ?  ?Mobility Comments: Community ambulator without AD, drives ?ADLs Comments: independent ?  ? ? ?Hand Dominance  ? Dominant Hand: Right ? ?  ?Extremity/Trunk Assessment  ? Upper Extremity Assessment ?Upper Extremity Assessment: Defer to OT evaluation ?  ? ?Lower Extremity Assessment ?Lower Extremity  Assessment: Overall WFL for tasks assessed ?  ? ?Cervical / Trunk Assessment ?Cervical / Trunk Assessment: Normal  ?Communication  ? Communication: No difficulties  ?Cognition Arousal/Alertness: Awake/alert ?Behavior During Therapy: [facility] for tasks assessed/performed ?Overall Cognitive Status: Within Functional Limits for tasks assessed ?  ?  ?  ?  ?  ?  ?  ?  ?  ?  ?  ?  ?  ?  ?  ?  ?  ?  ?  ? ?  ?General Comments   ? ?  ?Exercises    ? ?Assessment/Plan  ?  ?PT Assessment Patient does not need any further PT services  ?PT Problem List   ? ?   ?  ?PT Treatment Interventions     ? ?PT Goals (Current goals can be found in the Care Plan section)  ?Acute Rehab PT Goals ?Patient Stated Goal: return home ?PT Goal Formulation: With patient ?Time For Goal Achievement: 01/07/22 ?Potential to Achieve Goals: Good ? ?  ?Frequency   ?  ? ? ?Co-evaluation   ?  ?  ?  ?  ? ? ?  ?AM-PAC PT "6 Clicks" Mobility  ?Outcome Measure Help needed turning from your back to your side while in a flat bed without using bedrails?: None ?Help needed moving from lying on your back to sitting on the side of a flat bed without using bedrails?: None ?Help needed moving to and from a bed to a chair (including a wheelchair)?: None ?Help needed standing up from a chair using your arms (e.g., wheelchair or bedside chair)?: None ?Help needed to walk in hospital room?: None ?Help needed climbing 3-5 steps with a railing? : None ?6 Click Score: 24 ? ?  ?End of Session   ?Activity Tolerance: Patient tolerated treatment well ?Patient left: in bed;with call bell/phone within reach ?Nurse Communication: Mobility status ?PT Visit Diagnosis: Unsteadiness on feet (R26.81);Other abnormalities of gait and mobility (R26.89);Muscle weakness (generalized) (M62.81) ?  ? ?Time: [id number] ?PT Time Calculation (min) (ACUTE ONLY): 14 min ? ? ?Charges:   PT Evaluation ?$PT Eval Low Complexity: 1 Low ?PT Treatments ?$Therapeutic Activity: 8-22 mins ?  ?   ? ? ?11:22 AM,  01/07/22 ?Lonell Grandchild, MPT ?Physical Therapist with [place] ?[facility] ?[phone] office ?[phone] mobile phone ? ? ?

## 2022-01-07 NOTE — Assessment & Plan Note (Signed)
Continue management as described for TIA ?

## 2022-01-07 NOTE — Assessment & Plan Note (Addendum)
Patient presents with left facial droop and slurred speech as well as ambulatory difficulty which has since resolved ?CT head without contrast showed no acute intracranial abnormality ?Patient will be admitted to telemetry unit  ?CT angiography of head and neck per teleneurology recommendation ?Echocardiogram in the morning ?MRI of brain without contrast in the morning per teleneurology recommendation ?EEG will be done in the morning per teleneurology recommendation ?Continue aspirin and statin ?Continue fall precautions and neuro checks ?Lipid panel and hemoglobin A1c will be checked ?Continue PT/OT eval and treat ?Bedside swallow eval by nursing prior to diet ?Tele neurology will be consulted to follow-up with patient in the morning.  ?

## 2022-01-07 NOTE — Assessment & Plan Note (Signed)
Antihypertensives PRN if Blood pressure is greater than 220/120 or there is a concern for End organ damage/contraindications for permissive HTN. If blood pressure is greater than 220/120 give labetalol PO or IV or Vasotec IV with a goal of 15% reduction in BP during the first 24 hours. ? ?

## 2022-01-07 NOTE — Procedures (Signed)
Patient Name: Alejandro Sherman  ?MRN: [id number]  ?Epilepsy Attending: Lora Havens  ?Referring Physician/Provider: Bernadette Hoit, DO ?Date: 01/07/2022 ?Duration: 22.20 mins ? ?Patient history: 58M presents with recurrent dizziness, headache and AMS. EEG to evaluate for seizure. ? ?Level of alertness: Awake,asleep ? ?AEDs during EEG study: None ? ?Technical aspects: This EEG study was done with scalp electrodes positioned according to the 10-20 International system of electrode placement. Electrical activity was acquired at a sampling rate of 500Hz  and reviewed with a high frequency filter of 70Hz  and a low frequency filter of 1Hz . EEG data were recorded continuously and digitally stored.  ? ?Description: The posterior dominant rhythm consists of 8-9 Hz activity of moderate voltage (25-35 uV) seen predominantly in posterior head regions, symmetric and reactive to eye opening and eye closing. Sleep was characterized by vertex waves, sleep spindles (12 to 14 Hz), maximal frontocentral region. Hyperventilation and photic stimulation were not performed.    ? ?IMPRESSION: ?This study is within normal limits. No seizures or epileptiform discharges were seen throughout the recording. ? ?Lora Havens  ? ?

## 2022-01-07 NOTE — Assessment & Plan Note (Signed)
-   Continue Crestor and Zetia 

## 2022-01-07 NOTE — Discharge Summary (Signed)
Alejandro Sherman, is a 76 y.o. male  DOB 1946-08-16  MRN [id number].  Admission date:  01/06/2022  Admitting Physician  Bernadette Hoit, DO  Discharge Date:  01/07/2022   Primary MD  Hix, Jonna Munro, MD  Recommendations for primary care physician for things to follow:   1) your metoprolol/Toprol-XL medication has been decreased to 50 mg daily from 100 mg daily due to concerns about low blood pressure and slow heart rate  2) take aspirin and Crestor as prescribed for stroke prevention  3)follow up with the stroke neurologist Dr. Geradine Girt 4 to 6 weeks for recheck and reevaluation  4) avoid prolonged fasting and low blood sugars  5) consider referral to ophthalmologist/eye doctor if your visual disturbance persist  6)Avoid ibuprofen/Advil/Aleve/Motrin/Goody Powders/Naproxen/BC powders/Meloxicam/Diclofenac/Indomethacin and other Nonsteroidal anti-inflammatory medications as these will make you more likely to bleed and can cause stomach ulcers, can also cause Kidney problems.   Admission Diagnosis  Transient ischemic attack (TIA) [G45.9]   Discharge Diagnosis  Transient ischemic attack (TIA) [G45.9]    Principal Problem:   Transient ischemic attack (TIA) Active Problems:   OSA on CPAP   History of stroke   Essential hypertension   Mixed hyperlipidemia      Past Medical History:  Diagnosis Date   Bell's palsy    20 years ago, affecting the left facial nerve   CAD (coronary artery disease)    Edema    Essential hypertension, benign    History of Bell's palsy    Hyperlipidemia    Myocardial infarction ([place]) 10/28/1994   Obesity    OSA (obstructive sleep apnea)    on CPAP,  12-19-05   Ulcerative colitis ([place])     Past Surgical History:  Procedure Laterality Date   Ulcerative colitis  04/2010   Hospitalization     HPI  from the history and physical done on the day of admission:      HPI: Alejandro Sherman is a 76 y.o. 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  Patient states that he got up to use the bathroom when he noted a left facial droop and slurred speech as well as difficulty in walking.  He endorsed feeling flushed and having headache around the same time and since symptoms with similar to symptoms he had in January when he was diagnosed to have small posterior circulation infarct with ataxia, he states that he went to take some baby aspirin in and is ready for improved symptoms.  ED notified wife and it was decided to activate EMS.  Patient was brought to the ED for further evaluation and management.  While in the ED, symptoms resolved and was back to his baseline.  ED course: In the emergency department, he was intermittently bradycardic, other vital signs were within normal range.  Work-up in the ED showed normal  CBC and BMP, urinalysis was negative, urine drug screen was negative, alcohol level was less than 10.  Influenza A, B, SARS coronavirus 2 was negative. CT head without contrast showed no acute intracranial abnormality Telemetry neurologist was consulted and recommended further stroke work-up.  Hospitalist was asked to admit patient for further evaluation and management.   Review of Systems: As mentioned in the history of present illness. All other systems reviewed and are negative.    Hospital Course:   Assessment and Plan:  1) neuro symptoms with concerns for possible stroke -Neuroimaging studies continues to show remote prior strokes -Pt presented this time with Lt arm numbness, facial numbness and paralysis, unable to swallow aspirin,  slurred speech, Lt sided frontal headache, vertigo/ataxia and gait issues---from 2045 last night  , resolved before midnight --Intermittent visual disturbance since January -Patient had stroke evaluation in January 2023 -CTA head and neck without LVO -Patient has unchanged occlusion or high-grade stenosis of the proximal left vertebral with partial reconstitution at C2 and diminished intracranial enhancement -MRI brain without new strokes -There is evidence of remote left occipital infarct and small left cerebellar infarct -Compared to neuroimaging studies from January 2023 no New acute findings -Patient was recently evaluated by neurology midlevel practitioner Ivery Quale 12/13/2021 -Patient and family would like to follow-up with stroke neurologist -He is advised to follow-up with Dr. Leonie Man at [facility] -There is concern the patient's symptoms may be due to recurrent TIAs -Previously had a 30-day event monitor without acute findings specifically no A-fib - echo from 01/07/2022 with preserved EF of 65 to 70%, no regional wall motion normalities, -Recent echo from 11/19/2021 also showed preserved EF of 60 to 65% without wall motion normalities at the time, no aortic stenosis and no mitral stenosis -EEG completed, official reading pending -Patient does intermittent fasting going without food and caloric intake for up to 14 hours a day--- cannot rule out possible hypoglycemic episodes as this may mimic patient's symptoms -Decrease Toprol-XL to  50 mg daily due to concerns for hypotensive episode -Recent A1c was 5.7  2)OSA--- patient is compliant with CPAP   3)HTN-- Decrease Toprol-XL to  50 mg daily due to concerns for hypotensive episode -Continue Aldactone  Discharge Condition: stable  Follow UP   Follow-up Information     Garvin Fila, MD. Schedule an appointment as soon as possible for a visit in 1 month(s).   Specialties: Neurology, Radiology Why: TIA/stroke evaluation Contact information: [address] [place]  [address] [phone]                 Diet and Activity recommendation:  As advised  Discharge Instructions   Discharge Instructions     Call MD for:  persistant dizziness or light-headedness   Complete by: As directed    Call MD for:  persistant nausea and vomiting   Complete by: As directed    Call MD for:  temperature >100.4   Complete by: As directed    Diet - low sodium heart healthy   Complete by: As directed    Discharge instructions   Complete by: As directed    1) your metoprolol/Toprol-XL medication has been decreased to 50 mg daily from 100 mg daily due to concerns about low blood pressure and slow heart rate  2) take aspirin and Crestor as prescribed for stroke prevention  3)follow up with the stroke neurologist Dr. Geradine Girt 4 to 6 weeks for recheck and reevaluation  4) avoid prolonged  fasting and low blood sugars  5) consider referral to ophthalmologist/eye doctor if your visual disturbance persist  6)Avoid ibuprofen/Advil/Aleve/Motrin/Goody Powders/Naproxen/BC powders/Meloxicam/Diclofenac/Indomethacin and other Nonsteroidal anti-inflammatory medications as these will make you more likely to bleed and can cause stomach ulcers, can also cause Kidney problems.   Increase activity slowly   Complete by: As directed          Discharge Medications     Allergies as of 01/07/2022       Reactions   [place] Grass Extract Other (See Comments)   Sneezing, itchy watery eyes   Metoprolol Anxiety        Medication List     STOP taking these medications    aspirin 81 MG tablet Replaced by: aspirin EC 81 MG tablet   clopidogrel 75 MG tablet Commonly known as: PLAVIX   potassium chloride SA 20 MEQ tablet Commonly known as: KLOR-CON M       TAKE these medications    aspirin EC 81 MG tablet Take 1 tablet (81 mg total) by mouth daily with breakfast. Replaces: aspirin 81 MG tablet   Cholecalciferol 50 MCG (2000 UT) Tabs Take 2,000 Units by mouth  daily.   ezetimibe 10 MG tablet Commonly known as: ZETIA Take 10 mg by mouth daily.   fluticasone 50 MCG/ACT nasal spray Commonly known as: FLONASE Place 2 sprays into both nostrils daily.   loratadine 10 MG tablet Commonly known as: CLARITIN Take 10 mg by mouth.   meclizine 12.5 MG tablet Commonly known as: ANTIVERT Take 12.5 mg by mouth 3 (three) times daily as needed.   mesalamine 1.2 g EC tablet Commonly known as: LIALDA Take 1.2 g by mouth daily with breakfast.   metoprolol succinate 50 MG 24 hr tablet Commonly known as: Toprol XL Take 1 tablet (50 mg total) by mouth daily. Take with or immediately following a meal. What changed:  medication strength how much to take when to take this additional instructions   nitroGLYCERIN 0.4 MG SL tablet Commonly known as: NITROSTAT Place 1 tablet under the tongue every 5 (five) minutes as needed.   ondansetron 4 MG tablet Commonly known as: ZOFRAN Take 4 mg by mouth every 6 (six) hours as needed.   Rosuvastatin Calcium 40 MG Cpsp Take 1 tablet by mouth daily.   spironolactone 25 MG tablet Commonly known as: ALDACTONE Take 1 tablet (25 mg total) by mouth daily.        Major procedures and Radiology Reports - PLEASE review detailed and final reports for all details, in brief -   CT ANGIO HEAD NECK W WO CM  Result Date: 01/07/2022 CLINICAL DATA:  Neuro deficit, acute, stroke suspected; left facial droop and slurred speech EXAM: CT ANGIOGRAPHY HEAD AND NECK TECHNIQUE: Multidetector CT imaging of the head and neck was performed using the standard protocol during bolus administration of intravenous contrast. Multiplanar CT image reconstructions and MIPs were obtained to evaluate the vascular anatomy. Carotid stenosis measurements (when applicable) are obtained utilizing NASCET criteria, using the distal internal carotid diameter as the denominator. RADIATION DOSE REDUCTION: This exam was performed according to the departmental  dose-optimization program which includes automated exposure control, adjustment of the mA and/or kV according to patient size and/or use of iterative reconstruction technique. CONTRAST:  21mL OMNIPAQUE IOHEXOL 350 MG/ML SOLN COMPARISON:  11/18/2021 FINDINGS: CTA NECK Aortic arch: Calcified plaque along the arch and patent great vessel origins. Right carotid system: Patent. Calcified plaque along the common carotid without stenosis. Calcified plaque at  the bifurcation and proximal internal carotid. There is less than 50% stenosis. Left carotid system: Patent. Calcified plaque along the common carotid without stenosis. Calcified plaque at the bifurcation and proximal internal carotid with minimal stenosis. Vertebral arteries: Right vertebral is patent with mild calcified plaque at the origin. As before, there is marked calcified plaque along the proximal left vertebral with presumed occlusion or high-grade stenosis. Small caliber vessel is present with subsequent occlusion at about C5 with partial reconstitution at C2. Skeleton: Stable cervical spine degenerative changes. Degenerative changes at the temporomandibular joints. Other neck: No new finding. Upper chest: No new finding. Review of the MIP images confirms the above findings CTA HEAD Anterior circulation: Intracranial internal carotid arteries are patent with calcified plaque causing mild stenosis. Anterior and middle cerebral arteries are patent. Posterior circulation: Intracranial right vertebral artery is patent. There is diminished enhancement of the intracranial left vertebral artery with superimposed plaque proximally similar to prior. Basilar artery is patent. Bilateral posterior communicating arteries are present. Posterior cerebral arteries are patent. Venous sinuses: Patent as allowed by contrast bolus timing. Review of the MIP images confirms the above findings IMPRESSION: No new large vessel occlusion. 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 Electronically Signed   By: Macy Mis M.D.   On: 01/07/2022 11:09   MR BRAIN WO CONTRAST  Result Date: 01/07/2022 CLINICAL DATA:  Neuro deficit, acute, stroke suspected EXAM: MRI HEAD WITHOUT CONTRAST TECHNIQUE: Multiplanar, multiecho pulse sequences of the brain and surrounding structures were obtained without intravenous contrast. COMPARISON:  CT head code stroke January 06, 2022. MRI head 11/19/2021. FINDINGS: Brain: No acute infarction, acute hemorrhage, hydrocephalus, extra-axial collection or mass lesion. Remote left occipital infarct with hemosiderin deposition, similar. Small left cerebellar infarct. Vascular: Abnormal left vertebral artery flow void with vertebral artery further evaluated on same day CTA. Skull and upper cervical spine: Normal marrow signal. Sinuses/Orbits: Mild paranasal sinus mucosal thickening. Other: No mastoid effusions. IMPRESSION: 1. No evidence of acute intracranial abnormality. 2. Remote left occipital infarct and small left cerebellar infarct. 3. Abnormal left vertebral artery flow void with vertebral artery further evaluated on same day CTA. Electronically Signed   By: Margaretha Sheffield M.D.   On: 01/07/2022 10:29   Cardiac event monitor  Result Date: 01/02/2022  Normal sinus rhythm  No atrial fibrillation    CT HEAD CODE STROKE WO CONTRAST  Result Date: 01/06/2022 CLINICAL DATA:  Code stroke. Initial evaluation for neuro deficit, stroke suspected. EXAM: CT HEAD WITHOUT CONTRAST TECHNIQUE: Contiguous axial images were obtained from the base of the skull through the vertex without intravenous contrast. RADIATION DOSE REDUCTION: This exam was performed according to the departmental dose-optimization program which includes automated exposure control, adjustment of the mA and/or kV according to patient size and/or use of iterative reconstruction technique.  COMPARISON:  Recent head CT and MRI from 11/19/2021. FINDINGS: Brain: Age-related cerebral atrophy. Small chronic left PCA distribution infarct again noted involving the left occipital lobe. No acute intracranial hemorrhage. No acute large vessel territory infarct. No mass lesion or midline shift. No hydrocephalus or extra-axial fluid collection. Vascular: No hyperdense vessel. Calcified atherosclerosis present at skull base. Skull: Scalp soft tissues and calvarium within normal limits. Sinuses/Orbits: Globes orbital soft tissues demonstrate no acute finding. Mild mucosal thickening noted within the ethmoidal air cells. Paranasal sinuses are otherwise clear. No mastoid effusion. Other: None. ASPECTS [facility] Stroke Program Early CT Score) - Ganglionic level infarction (  caudate, lentiform nuclei, internal capsule, insula, M1-M3 cortex): 7 - Supraganglionic infarction (M4-M6 cortex): 3 Total score (0-10 with 10 being normal): 10 IMPRESSION: 1. Stable head CT.  No acute intracranial abnormality. 2. ASPECTS is 10. 3. Small remote left PCA distribution infarct. Results were called by telephone at the time of interpretation on 01/06/2022 at 10:31 pm to provider [facility] , who verbally acknowledged these results. Electronically Signed   By: Jeannine Boga M.D.   On: 01/06/2022 22:34   ECHOCARDIOGRAM LIMITED  Result Date: 01/07/2022    ECHOCARDIOGRAM LIMITED REPORT   Patient Name:   Alejandro Sherman Date of Exam: 01/07/2022 Medical Rec #:  [id number]          Height:       69.0 in Accession #:    [id number]         Weight:       198.8 lb Date of Birth:  Aug 07, 1946         BSA:          2.061 m Patient Age:    30 years           BP:           145/71 mmHg Patient Gender: M                  HR:           67 bpm. Exam Location:  [facility] Procedure: 2D Echo and Color Doppler Indications:    Stroke I63.9  History:        Patient has prior history of Echocardiogram examinations, most                 recent  11/19/2021. CAD and Previous Myocardial Infarction, TIA                 and Stroke; Risk Factors:Hypertension and Dyslipidemia. OSA on                 CPAP.  Sonographer:    Alvino Chapel RCS Referring Phys: [id number] OLADAPO ADEFESO IMPRESSIONS  1. Left ventricular ejection fraction, by estimation, is 65 to 70%. The left ventricle has normal function. The left ventricle has no regional wall motion abnormalities. There is mild concentric left ventricular hypertrophy.  2. Right ventricular systolic function is normal. The right ventricular size is normal.  3. The mitral valve is grossly normal. Trivial mitral valve regurgitation. Moderate mitral annular calcification.  4. The aortic valve is tricuspid. There is moderate calcification of the aortic valve. There is moderate thickening of the aortic valve.  5. The inferior vena cava is normal in size with greater than 50% respiratory variability, suggesting right atrial pressure of 3 mmHg. Comparison(s): No significant change from prior study. Conclusion(s)/Recommendation(s): No intracardiac source of embolism detected on this transthoracic study. Consider a transesophageal echocardiogram to exclude cardiac source of embolism if clinically indicated. FINDINGS  Left Ventricle: Left ventricular ejection fraction, by estimation, is 65 to 70%. The left ventricle has normal function. The left ventricle has no regional wall motion abnormalities. Definity contrast agent was given IV to delineate the left ventricular  endocardial borders. The left ventricular internal cavity size was normal in size. There is mild concentric left ventricular hypertrophy. Right Ventricle: The right ventricular size is normal. Right ventricular systolic function is normal. Mitral Valve: The mitral valve is grossly normal. There is mild thickening of the mitral valve leaflet(s). There is mild calcification of the mitral valve leaflet(s). Moderate  mitral annular calcification. Trivial mitral valve  regurgitation. Aortic Valve: The aortic valve is tricuspid. There is moderate calcification of the aortic valve. There is moderate thickening of the aortic valve. Aorta: The aortic root is normal in size and structure. Venous: The inferior vena cava is normal in size with greater than 50% respiratory variability, suggesting right atrial pressure of 3 mmHg. LEFT VENTRICLE PLAX 2D LVIDd:         4.60 cm LVIDs:         3.00 cm LV PW:         1.10 cm LV IVS:        1.00 cm LVOT diam:     1.90 cm LVOT Area:     2.84 cm  LEFT ATRIUM         Index LA diam:    4.00 cm 1.94 cm/m   AORTA Ao Root diam: 3.70 cm  SHUNTS Systemic Diam: 1.90 cm Gwyndolyn Kaufman MD Electronically signed by Gwyndolyn Kaufman MD Signature Date/Time: 01/07/2022/5:30:31 PM    Final     Micro Results   Recent Results (from the past 240 hour(s))  Resp Panel by RT-PCR (Flu A&B, Covid) Nasopharyngeal Swab     Status: None   Collection Time: 01/06/22 11:10 PM   Specimen: Nasopharyngeal Swab; Nasopharyngeal(NP) swabs in vial transport medium  Result Value Ref Range Status   SARS Coronavirus 2 by RT PCR NEGATIVE NEGATIVE Final    Comment: (NOTE) SARS-CoV-2 target nucleic acids are NOT DETECTED.  The SARS-CoV-2 RNA is generally detectable in upper respiratory specimens during the acute phase of infection. The lowest concentration of SARS-CoV-2 viral copies this assay can detect is 138 copies/mL. A negative result does not preclude SARS-Cov-2 infection and should not be used as the sole basis for treatment or other patient management decisions. A negative result may occur with  improper specimen collection/handling, submission of specimen other than nasopharyngeal swab, presence of viral mutation(s) within the areas targeted by this assay, and inadequate number of viral copies(<138 copies/mL). A negative result must be combined with clinical observations, patient history, and epidemiological information. The expected result is  Negative.  Fact Sheet for Patients:  EntrepreneurPulse.com.au  Fact Sheet for Healthcare Providers:  IncredibleEmployment.be  This test is no t yet approved or cleared by the [place] FDA and  has been authorized for detection and/or diagnosis of SARS-CoV-2 by FDA under an Emergency Use Authorization (EUA). This EUA will remain  in effect (meaning this test can be used) for the duration of the COVID-19 declaration under Section 564(b)(1) of the Act, 21 U.S.C.section 360bbb-3(b)(1), unless the authorization is terminated  or revoked sooner.       Influenza A by PCR NEGATIVE NEGATIVE Final   Influenza B by PCR NEGATIVE NEGATIVE Final    Comment: (NOTE) The Xpert Xpress SARS-CoV-2/FLU/RSV plus assay is intended as an aid in the diagnosis of influenza from Nasopharyngeal swab specimens and should not be used as a sole basis for treatment. Nasal washings and aspirates are unacceptable for Xpert Xpress SARS-CoV-2/FLU/RSV testing.  Fact Sheet for Patients: EntrepreneurPulse.com.au  Fact Sheet for Healthcare Providers: IncredibleEmployment.be  This test is not yet approved or cleared by the [place] FDA and has been authorized for detection and/or diagnosis of SARS-CoV-2 by FDA under an Emergency Use Authorization (EUA). This EUA will remain in effect (meaning this test can be used) for the duration of the COVID-19 declaration under Section 564(b)(1) of the Act, 21 U.S.C. section 360bbb-3(b)(1), unless the  authorization is terminated or revoked.  Performed at [facility], [address] [place], [place] [address]     Today   Subjective    Alejandro Sherman today has no new complaints  No fever  Or chills  -Patient's wife and son at bedside -Neuro symptoms have resolved -Patient ambulating in the hallways with no neurodeficits no chest pains no dyspnea on exertion no dizziness no palpitations           Patient has been seen and examined prior to discharge   Objective   Blood pressure 124/71, pulse 63, temperature 97.9 F (36.6 C), temperature source Oral, resp. rate 16, height 5\' 9"  (1.753 m), weight 90.2 kg, SpO2 99 %.   Intake/Output Summary (Last 24 hours) at 01/07/2022 1745 Last data filed at 01/07/2022 1300 Gross per 24 hour  Intake 560 ml  Output --  Net 560 ml    Exam Gen:- Awake Alert, no acute distress  HEENT:- [place].AT, No sclera icterus Neck-Supple Neck,No JVD,.  Lungs-  CTAB , good air movement bilaterally CV- S1, S2 normal, regular Abd-  +ve B.Sounds, Abd Soft, No tenderness,    Extremity/Skin:- No  edema,   good pulses Psych-affect is appropriate, oriented x3 Neuro-no new focal deficits, no tremors    Data Review   CBC w Diff:  Lab Results  Component Value Date   WBC 8.0 01/07/2022   HGB 14.7 01/07/2022   HCT 45.7 01/07/2022   PLT 229 01/07/2022   LYMPHOPCT 25 01/06/2022   MONOPCT 7 01/06/2022   EOSPCT 4 01/06/2022   BASOPCT 1 01/06/2022    CMP:  Lab Results  Component Value Date   NA 137 01/07/2022   K 3.9 01/07/2022   CL 106 01/07/2022   CO2 24 01/07/2022   BUN 17 01/07/2022   CREATININE 0.67 01/07/2022   PROT 6.1 (L) 01/07/2022   ALBUMIN 3.2 (L) 01/07/2022   BILITOT 0.6 01/07/2022   ALKPHOS 53 01/07/2022   AST 24 01/07/2022   ALT 37 01/07/2022  .  Total Discharge time is about 33 minutes  Roxan Hockey M.D on 01/07/2022 at 5:45 PM  Go to [email or web address] -  for contact info  [facility] - Office  [phone]

## 2022-01-07 NOTE — Progress Notes (Signed)
Nsg Discharge Note ? ?Admit Date:  01/06/2022 ?Discharge date: 01/07/2022 ?  ?Kerby Nora to be D/C'd Home per MD order.  AVS completed.  Copy for chart, and copy for patient signed, and dated. ?Patient/caregiver able to verbalize understanding. ? ?Discharge Medication: ?Allergies as of 01/07/2022   ? ?   Reactions  ? [place] Grass Extract Other (See Comments)  ? Sneezing, itchy watery eyes  ? Metoprolol Anxiety  ? ?  ? ?  ?Medication List  ?  ? ?STOP taking these medications   ? ?aspirin 81 MG tablet ?Replaced by: aspirin EC 81 MG tablet ?  ?clopidogrel 75 MG tablet ?Commonly known as: PLAVIX ?  ?potassium chloride SA 20 MEQ tablet ?Commonly known as: KLOR-CON M ?  ? ?  ? ?TAKE these medications   ? ?aspirin EC 81 MG tablet ?Take 1 tablet (81 mg total) by mouth daily with breakfast. ?Replaces: aspirin 81 MG tablet ?  ?Cholecalciferol 50 MCG (2000 UT) Tabs ?Take 2,000 Units by mouth daily. ?  ?ezetimibe 10 MG tablet ?Commonly known as: ZETIA ?Take 10 mg by mouth daily. ?  ?fluticasone 50 MCG/ACT nasal spray ?Commonly known as: FLONASE ?Place 2 sprays into both nostrils daily. ?  ?loratadine 10 MG tablet ?Commonly known as: CLARITIN ?Take 10 mg by mouth. ?  ?meclizine 12.5 MG tablet ?Commonly known as: ANTIVERT ?Take 12.5 mg by mouth 3 (three) times daily as needed. ?  ?mesalamine 1.2 g EC tablet ?Commonly known as: LIALDA ?Take 1.2 g by mouth daily with breakfast. ?  ?metoprolol succinate 50 MG 24 hr tablet ?Commonly known as: Toprol XL ?Take 1 tablet (50 mg total) by mouth daily. Take with or immediately following a meal. ?What changed:  ?medication strength ?how much to take ?when to take this ?additional instructions ?  ?nitroGLYCERIN 0.4 MG SL tablet ?Commonly known as: NITROSTAT ?Place 1 tablet under the tongue every 5 (five) minutes as needed. ?  ?ondansetron 4 MG tablet ?Commonly known as: ZOFRAN ?Take 4 mg by mouth every 6 (six) hours as needed. ?  ?Rosuvastatin Calcium 40 MG Cpsp ?Take 1 tablet by mouth  daily. ?  ?spironolactone 25 MG tablet ?Commonly known as: ALDACTONE ?Take 1 tablet (25 mg total) by mouth daily. ?  ? ?  ? ? ?Discharge Assessment: ?Vitals:  ? 01/07/22 1440 01/07/22 1748  ?BP: 124/71 (!) 147/70  ?Pulse: 63 65  ?Resp: 16 18  ?Temp: 97.9 ?F (36.6 ?C) 98.4 ?F (36.9 ?C)  ?SpO2: 99% 98%  ? Skin clean, dry and intact without evidence of skin break down, no evidence of skin tears noted. ?IV catheter discontinued intact. Site without signs and symptoms of complications - no redness or edema noted at insertion site, patient denies c/o pain - only slight tenderness at site.  Dressing with slight pressure applied. ? ?D/c Instructions-Education: ?Discharge instructions given to patient/family with verbalized understanding. ?D/c education completed with patient/family including follow up instructions, medication list, d/c activities limitations if indicated, with other d/c instructions as indicated by MD - patient able to verbalize understanding, all questions fully answered. ?Patient instructed to return to ED, call 911, or call MD for any changes in condition.  ?Patient escorted via WC, and D/C home via private auto. ? ?Billie Lade King,LPN ?06/25/5620 3:08 PM  ?

## 2022-01-07 NOTE — Progress Notes (Incomplete)
°  Lt arm numbness, facial numbness and paralysis, unable to swallow aspirin, slurred speech, Lt sided frontal headache, vertigo/ataxia and gait issues---from 2045 last night , resolved before midnight -- Intermittent visual disturbance since January - Talib Headley, MD

## 2022-01-07 NOTE — Assessment & Plan Note (Signed)
Continue CPAP.  

## 2022-02-25 ENCOUNTER — Other Ambulatory Visit: Payer: Self-pay

## 2022-02-25 NOTE — Patient Outreach (Signed)
[facility]) Care Management ? ?02/25/2022 ? ?Alejandro Sherman ?12-09-1945 ?[id number] ? ? ?Telephone outreach to patient to obtain mRS was successfully completed. MRS= 1 ? ?Alejandro Sherman ?[facility] Care Management Assistant ?[phone] ? ?

## 2022-09-18 IMAGING — CT CT HEAD CODE STROKE
3 series · 15 of 47 positions shown, 18 images · non-contrast
Comparison: None.

CLINICAL DATA: Code stroke.  75-year-old male left side weakness.



[Series 3: head w o · axial · 0.42mm/px · z∈[-13,+132]mm · 9 of 35 slices shown, 12 images]
[im 3/35  brain]
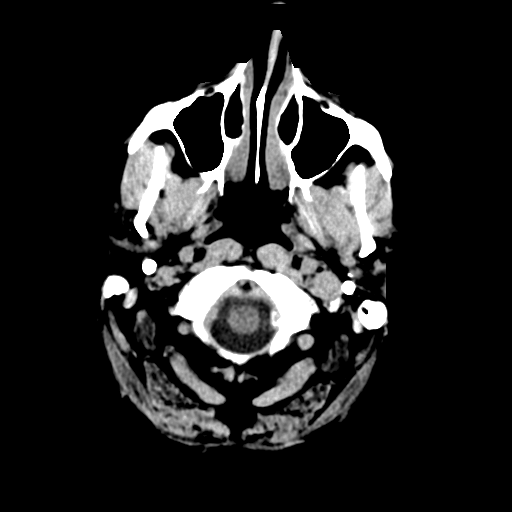
[im 3/35  bone]
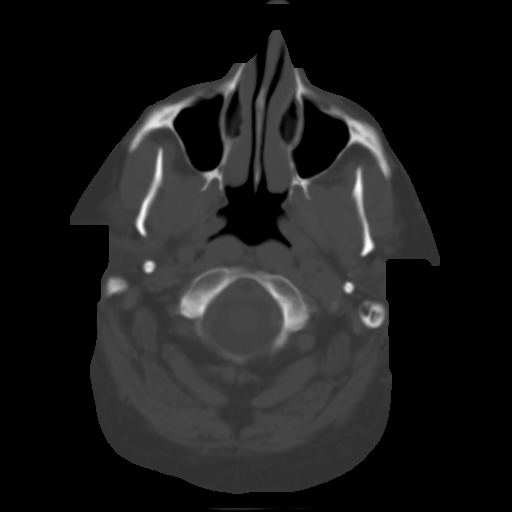
[im 6/35  brain]
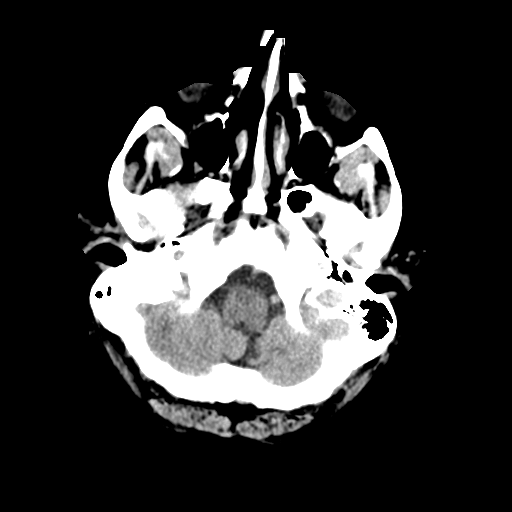
[im 10/35  brain]
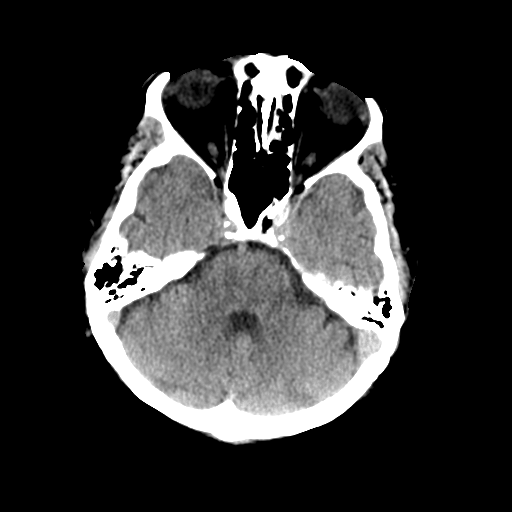
[im 13/35  brain]
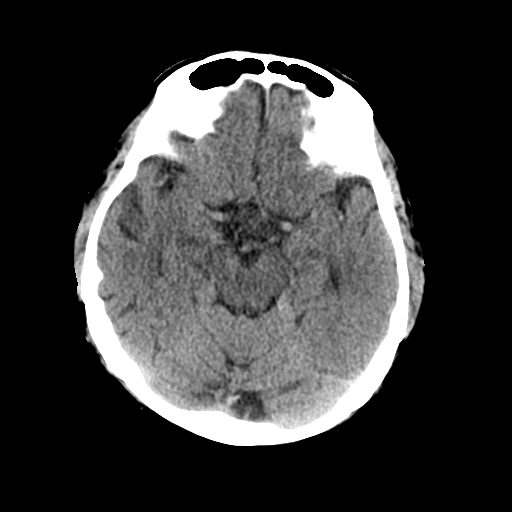
[im 18/35  brain]
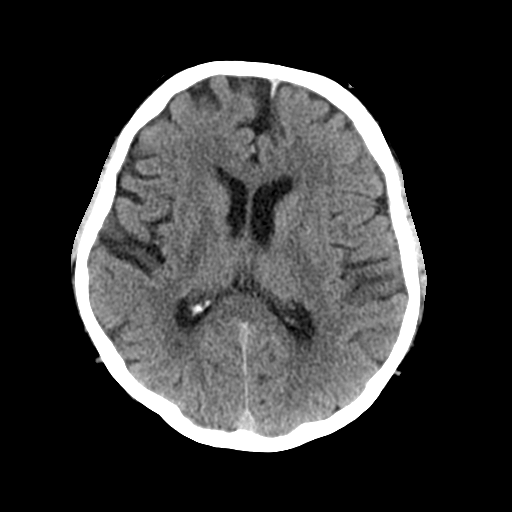
[im 18/35  bone]
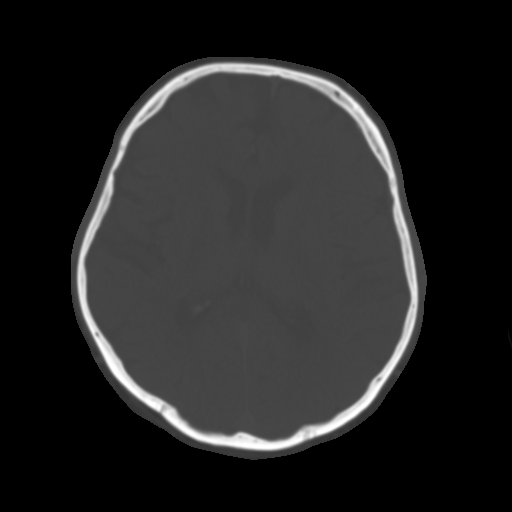
[im 22/35  brain]
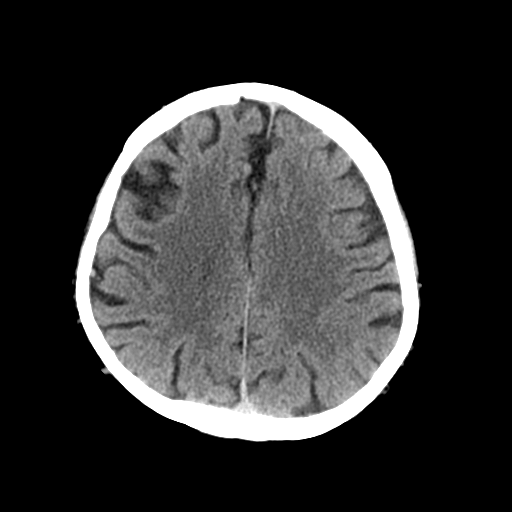
[im 25/35  brain]
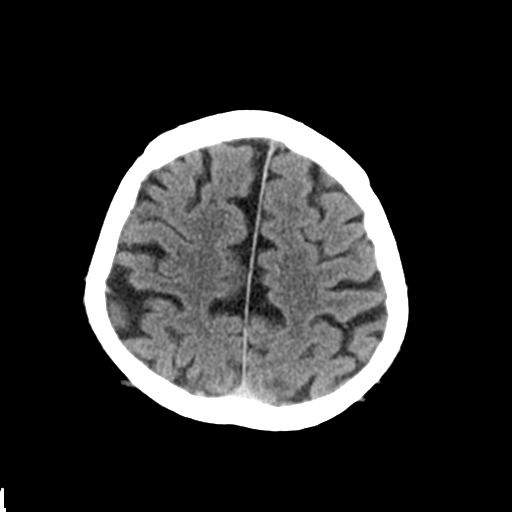
[im 29/35  brain]
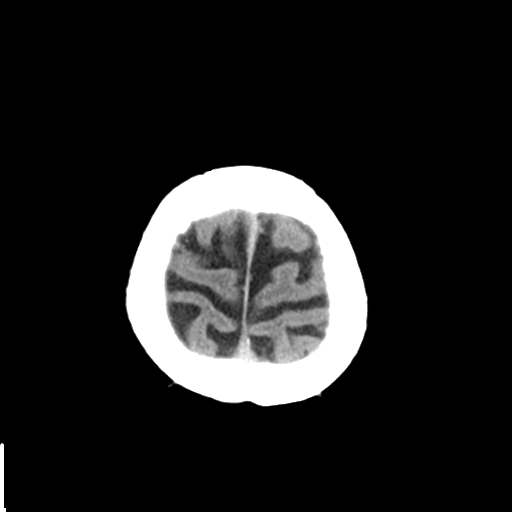
[im 32/35  brain]
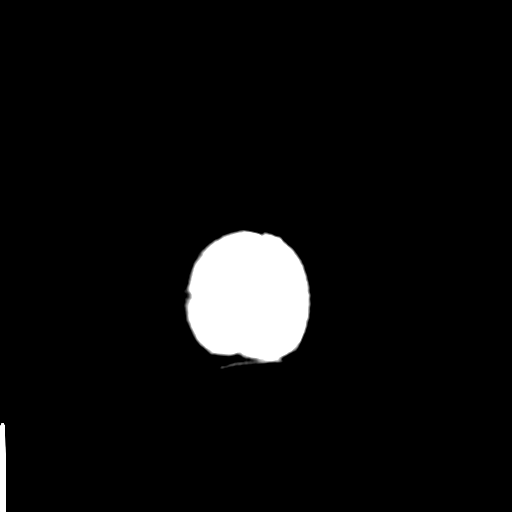
[im 32/35  bone]
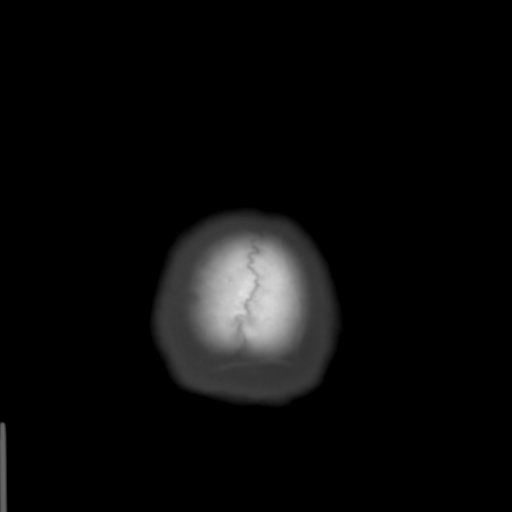

[Series 5: coronal soft · coronal · 0.35mm/px · 3 of 73 slices shown]
[im 25/73  brain]
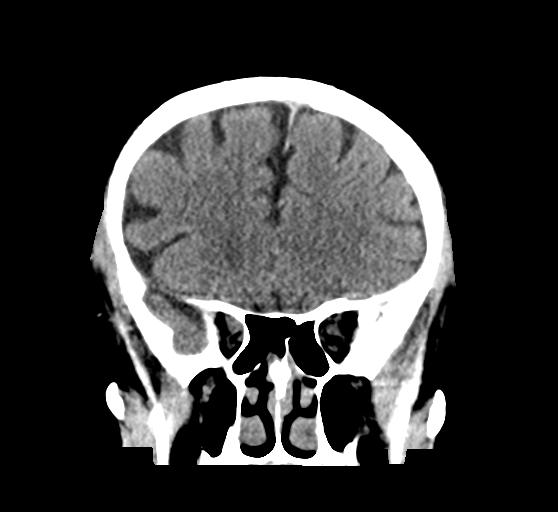
[im 33/73  brain]
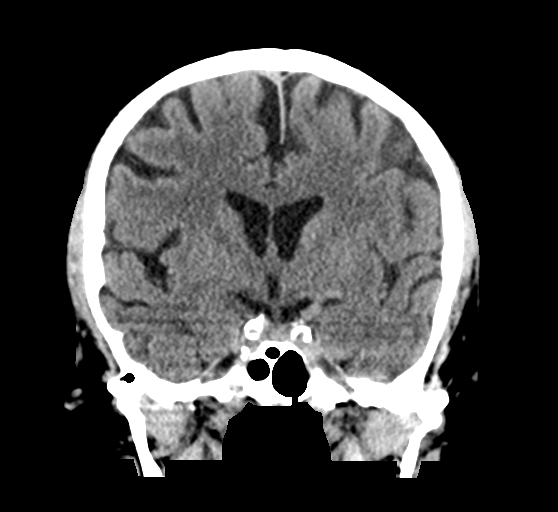
[im 41/73  brain]
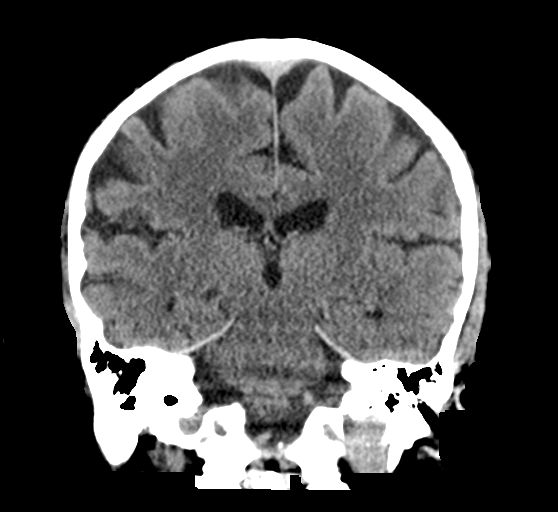

[Series 6: sagittal soft · sagittal · 0.34mm/px · 3 of 64 slices shown]
[im 22/64  brain]
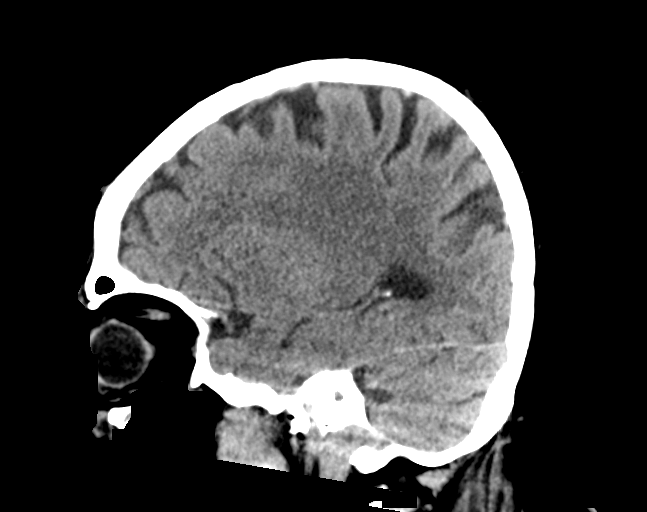
[im 32/64  brain]
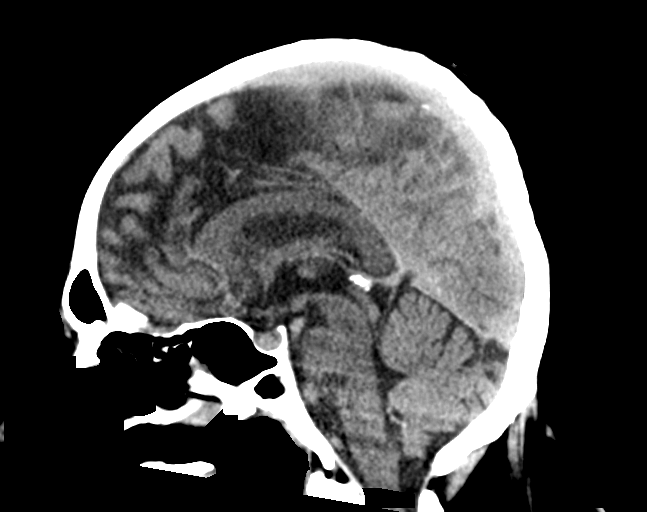
[im 43/64  brain]
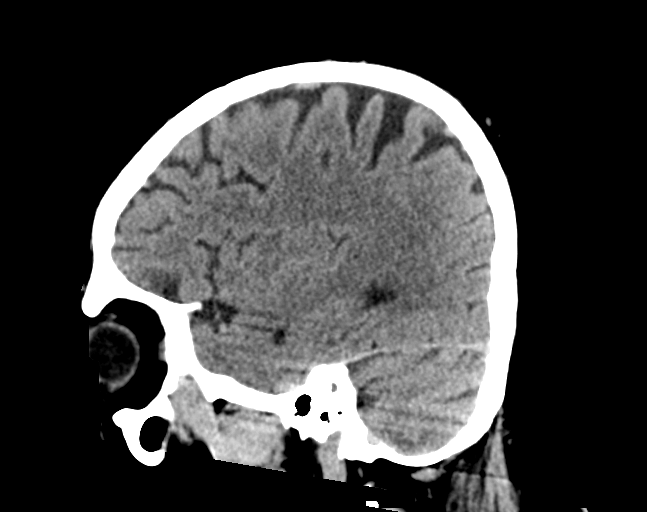

[15 of 47 positions shown; findings below may reference images not displayed]

FINDINGS: Brain: Cerebral volume is within normal limits for age. No midline
shift, ventriculomegaly, mass effect, evidence of mass lesion,
intracranial hemorrhage or evidence of cortically based acute
infarction. Gray-white matter differentiation appears symmetric and
within normal limits for age.

Vascular: Calcified atherosclerosis at the skull base. No suspicious
intracranial vascular hyperdensity.

Skull: Negative.

Sinuses/Orbits: Visualized paranasal sinuses and mastoids are clear.
Tympanic cavities are clear.

Other: Leftward gaze deviation. Normal stylomastoid foramina.
Visualized scalp soft tissues are within normal limits.

ASPECTS (Alberta Stroke Program Early CT Score)

Total score (0-10 with 10 being normal): 10.
IMPRESSION: Normal for age non contrast CT appearance of the brain. ASPECTS 10.

Study discussed by telephone with Dr. Siew in the ED on 11/18/2021

## 2022-12-16 ENCOUNTER — Ambulatory Visit: Payer: Medicare Other | Admitting: Adult Health

## 2022-12-18 ENCOUNTER — Ambulatory Visit: Payer: Medicare Other | Admitting: Adult Health

## 2023-01-13 ENCOUNTER — Ambulatory Visit: Payer: Medicare Other | Admitting: Adult Health

## 2023-01-13 ENCOUNTER — Encounter: Payer: Self-pay | Admitting: Adult Health

## 2023-01-13 VITALS — BP 133/75 | HR 66 | Ht 69.0 in | Wt 202.2 lb

## 2023-01-13 DIAGNOSIS — G4733 Obstructive sleep apnea (adult) (pediatric): Secondary | ICD-10-CM

## 2023-01-13 DIAGNOSIS — G459 Transient cerebral ischemic attack, unspecified: Secondary | ICD-10-CM

## 2023-01-13 DIAGNOSIS — I6502 Occlusion and stenosis of left vertebral artery: Secondary | ICD-10-CM

## 2023-01-13 NOTE — Progress Notes (Signed)
PATIENT: Alejandro Sherman DOB: 03/30/1946  REASON FOR VISIT: follow up HISTORY FROM: patient  Chief Complaint  Patient presents with   Follow-up    Pt in 19 with wife Pt here for CPAP f/u Pt states no questions about CPAP machine . Pt states has questions about his General health Pt states 2 strokes in last year Pt states January and March 2023     HISTORY OF PRESENT ILLNESS: Today 01/13/23 :  Alejandro Sherman is a 77 y.o. male with a history of obstructive sleep apnea on CPAP and TIA. Returns today for follow-up.  Overall the patient has been doing well.  Denies any strokelike symptoms.  Remains on aspirin twice a day.  Primary care is managing risk factors.  Reports that visual disturbance has resolved.  Has a history of Bell's palsy.  CPAP download is below.    12/13/21: Alejandro Sherman is a 77 year old male with a history of obstructive sleep apnea on CPAP.  His download is below.  Patient more recently went into the hospital on January 22 due to TIA.  He reports that he woke up at 7:30 AM with a mild headache.  Around 830 he started feeling hot and flush continue to have a mild headache but now with left facial tingling and slurred speech.  Once he got up to go to the bathroom he realized that he was off balance.  His wife called EMS.  He was given tPA.  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  Small chronic left PICA territory infarct  LDL 51-currently on  Rosuvatatin 40 mg and Zetia 10 mg. hemoglobin A1c 5.7.  Discharge from the hospital on aspirin 81 mg twice daily and Plavix 75 mg daily for 3 weeks.  ECHO: EF 60-65 %    12/13/20: Alejandro Sherman is a 77 year old male with a history of obstructive sleep apnea on CPAP and memory  disturbance.  He returns today for follow-up.  He was sent for neuropsychological testing for memory disturbance.  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  Dr. Darol Destine discussed recommendations with the patient.  The patient reports that he stopped Aricept.  He feels that his symptoms have remained stable.  He has a follow-up with Dr. Darol Destine.  He reports that he uses his CPAP nightly.  Denies any new symptoms.  Returns today for follow-up.  Compliance Report Compliance Payor Standard Usage 01/15/2020 - 02/13/2020 Usage days 30/30 days (100%) >= 4 hours 30 days (100%) Average usage (days used) 6 hours 33 minutes   AirSense 10 AutoSet Serial number [id number] Mode AutoSet Min Pressure 5 cmH2O Max Pressure 13 cmH2O EPR Fulltime EPR level 1  Therapy Pressure - cmH2O Median: 7.7 95th percentile: 10.2 Maximum: 11.2 Leaks - L/min Median: 6.0 95th percentile: 22.8 Maximum: 32.4 Events per hour AI: 1.1 HI: 0.3 AHI: 1.4 Apnea Index Central: 0.7 Obstructive: 0.3 Unknown: 0.0  06/12/20: Alejandro Sherman is a 77 year old male with a history of obstructive sleep apnea on CPAP and memory disturbance.  He returns today to discuss his memory.  He reports that when he initially started Aricept he noticed a change in his memory and attentiveness.  He also reports that his wife noticed this as well.  He states that  since then these benefits have leveled off.  He has noticed some changes in his mood and behavior especially towards his wife.  He also notices some forgetfulness and trouble with his attention.  He returns today for an evaluation.  HISTORY 12/13/19:   Alejandro Sherman is a 77 year old male with a history of obstructive sleep apnea on CPAP and memory disturbance.  He returns today for follow-up.  His download indicates that he use his machine 30 out of 30 days for compliance of 100%.  He uses machine greater than 4 hours each night.  On average he uses his machine 6 hours and 56  minutes.  His residual AHI is two on 5 to 13 cm of water with EPR of one.  His leak in the 95th percentile is 24.6 L/min.  He reports that the CPAP continues to work well for him.  He continues to notice some changes with his memory.  Reports that he can walk into a room and forget what he went in for.  He does state eventually it will come to him.  He does have hearing aids but reports that he does not wear them consistently now that we have to wear a mask.  He is able to complete all ADLs independently.  He manages his own finances.  Denies any trouble cooking.  He is currently retired and lives at home with his wife.  REVIEW OF SYSTEMS: Out of a complete 14 system review of symptoms, the patient complains only of the following symptoms, and all other reviewed systems are negative.  ESS 5 FSS21  ALLERGIES: Allergies  Allergen Reactions   [place] Grass Extract Other (See Comments)    Sneezing, itchy watery eyes   Metoprolol Anxiety    HOME MEDICATIONS: Outpatient Medications Prior to Visit  Medication Sig Dispense Refill   Cholecalciferol 50 MCG (2000 UT) TABS Take 2,000 Units by mouth daily.     ezetimibe (ZETIA) 10 MG tablet Take 10 mg by mouth daily.     fluticasone (FLONASE) 50 MCG/ACT nasal spray Place 2 sprays into both nostrils daily.     loratadine (CLARITIN) 10 MG tablet Take 10 mg by mouth.     mesalamine (LIALDA) 1.2 G EC tablet Take 1.2 g by mouth daily with breakfast.     nitroGLYCERIN (NITROSTAT) 0.4 MG SL tablet Place 1 tablet under the tongue every 5 (five) minutes as needed.     Rosuvastatin Calcium 40 MG CPSP Take 1 tablet by mouth daily. 90 capsule 3   spironolactone (ALDACTONE) 25 MG tablet Take 1 tablet (25 mg total) by mouth daily. 90 tablet 3   meclizine (ANTIVERT) 12.5 MG tablet Take 12.5 mg by mouth 3 (three) times daily as needed.     metoprolol succinate (TOPROL XL) 50 MG 24 hr tablet Take 1 tablet (50 mg total) by mouth daily. Take with or immediately following  a meal. 90 tablet 3   ondansetron (ZOFRAN) 4 MG tablet Take 4 mg by mouth every 6 (six) hours as needed.     No facility-administered medications prior to visit.    PAST MEDICAL HISTORY: Past Medical History:  Diagnosis Date   Bell's palsy    20 years ago, affecting the left facial nerve   CAD (coronary artery disease)    Edema    Essential hypertension, benign    History of Bell's palsy    Hyperlipidemia    Myocardial infarction ([place]) 10/28/1994   Obesity    OSA (obstructive sleep apnea)  on CPAP,  12-19-05   Ulcerative colitis ([place])     PAST SURGICAL HISTORY: Past Surgical History:  Procedure Laterality Date   Ulcerative colitis  04/2010   Hospitalization    FAMILY HISTORY: Family History  Problem Relation Age of Onset   Kidney cancer Mother    Stroke Mother    Heart attack Father    Ovarian cancer Sister    Sleep apnea Neg Hx     SOCIAL HISTORY: Social History   Socioeconomic History   Marital status: Married    Spouse name: Olin Hauser   Number of children: 2   Years of education: 16   Highest education level: Not on file  Occupational History   Not on file  Tobacco Use   Smoking status: Former   Smokeless tobacco: Never  Substance and Sexual Activity   Alcohol use: Not Currently   Drug use: No   Sexual activity: Not on file  Other Topics Concern   Not on file  Social History Narrative   Patient is married. Olin Hauser)   Patient has two adult children.   Patient is retired.   Patient has a college education.   Patient is right-handed.   Patient does not drink any caffeine.      Social Determinants of Health   Financial Resource Strain: Not on file  Food Insecurity: Not on file  Transportation Needs: Not on file  Physical Activity: Not on file  Stress: Not on file  Social Connections: Not on file  Intimate Partner Violence: Not on file      PHYSICAL EXAM  Vitals:   01/13/23 1353  BP: 133/75  Pulse: 66  Weight: 202 lb 3.2 oz (91.7 kg)   Height: 5\' 9"  (1.753 m)   Body mass index is 29.86 kg/m.      06/12/2020    1:53 PM 12/13/2019   10:30 AM 12/16/2017    1:04 PM  MMSE - Mini Mental State Exam  Orientation to time 5 5 5   Orientation to Place 5 5 4   Registration 3 3 3   Attention/ Calculation 5 4 5   Recall 3 3 3   Language- name 2 objects 2 2 2   Language- repeat 1 1 1   Language- follow 3 step command 3 1 3   Language- read & follow direction 1 1 1   Write a sentence 1 1 1   Copy design 0 1 1  Copy design-comments  named 15 animals   Total score 29 27 29      Generalized: Well developed, in no acute distress  Chest: Lungs clear to auscultation bilaterally  Neurological examination  Mentation: Alert oriented to time, place, history taking. Follows all commands speech and language fluent Cranial nerve II-XII: Extraocular movements were full, visual field were full on confrontational test Head turning and shoulder shrug  were normal and symmetric.  Left facial droop due to history of Bell's palsy Motor: The motor testing reveals 5 over 5 strength of all 4 extremities. Good symmetric motor tone is noted throughout.  Sensory: Sensory testing is intact to soft touch on all 4 extremities. No evidence of extinction is noted.  Gait and station: Gait is normal.    DIAGNOSTIC DATA (LABS, IMAGING, TESTING) - I reviewed patient records, labs, notes, testing and imaging myself where available.   Lab Results  Component Value Date   [id number] 12/13/2019   Lab Results  Component Value Date   TSH 2.390 12/13/2019      ASSESSMENT AND PLAN 77 y.o. year  old male  has a past medical history of Bell's palsy, CAD (coronary artery disease), Edema, Essential hypertension, benign, History of Bell's palsy, Hyperlipidemia, Myocardial infarction ([place]) (10/28/1994), Obesity, OSA (obstructive sleep apnea), and Ulcerative colitis ([place]). 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   Obstructive sleep apnea on CPAP  CPAP download shows good compliance Good treatment of apnea Encouraged to use CPAP nightly and greater than 4 hours each night  Follow-up in 1 year or sooner if needed    Ward Givens, MSN, NP-C 01/13/2023, 2:05 PM [facility] [address], [place], [place] [address] [phone]

## 2024-01-14 NOTE — Progress Notes (Unsigned)
 Setup date: 07/16/23

## 2024-01-15 ENCOUNTER — Encounter: Payer: Self-pay | Admitting: Adult Health

## 2024-01-15 ENCOUNTER — Ambulatory Visit: Payer: Medicare Other | Admitting: Adult Health

## 2024-01-15 VITALS — BP 132/68 | HR 54 | Ht 69.0 in | Wt 191.0 lb

## 2024-01-15 DIAGNOSIS — G4733 Obstructive sleep apnea (adult) (pediatric): Secondary | ICD-10-CM

## 2024-01-15 NOTE — Progress Notes (Signed)
 PATIENT: Alejandro Sherman DOB: 01/11/1946  REASON FOR VISIT: follow up HISTORY FROM: patient  Chief Complaint  Patient presents with   Follow-up    Patient in room #19 and alone. Patient states she is well and stable, with no new concerns.    HISTORY OF PRESENT ILLNESS: Today 01/15/24 :  Alejandro Sherman is a 78 y.o. male with a history of OSA on CPAP. Returns today for follow-up.  Reports that CPAP is working well for him.  Continues to find it beneficial.  He is followed at [facility] for history of stroke and memory disturbance.  His download is below     01/13/23: Alejandro Sherman is a 78 y.o. male with a history of obstructive sleep apnea on CPAP and TIA. Returns today for follow-up.  Overall the patient has been doing well.  Denies any strokelike symptoms.  Remains on aspirin twice a day.  Primary care is managing risk factors.  Reports that visual disturbance has resolved.  Has a history of Bell's palsy.  CPAP download is below.    12/13/21: Alejandro Sherman is a 78 year old male with a history of obstructive sleep apnea on CPAP.  His download is below.  Patient more recently went into the hospital on January 22 due to TIA.  He reports that he woke up at 7:30 AM with a mild headache.  Around 830 he started feeling hot and flush continue to have a mild headache but now with left facial tingling and slurred speech.  Once he got up to go to the bathroom he realized that he was off balance.  His wife called EMS.  He was given tPA.  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  Small chronic left PICA territory infarct  LDL 51-currently on  Rosuvatatin 40 mg and Zetia 10 mg. hemoglobin A1c 5.7.  Discharge from the hospital on aspirin 81 mg twice  daily and Plavix 75 mg daily for 3 weeks.  ECHO: EF 60-65 %    12/13/20: Alejandro Sherman is a 78 year old male with a history of obstructive sleep apnea on CPAP and memory disturbance.  He returns today for follow-up.  He was sent for neuropsychological testing for memory disturbance.  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  [name] discussed recommendations with the patient.  The patient reports that he stopped Aricept.  He feels that his symptoms have remained stable.  He has a follow-up with [name].  He reports that he uses his CPAP nightly.  Denies any new symptoms.  Returns today for follow-up.  Compliance Report Compliance Payor Standard Usage 01/15/2020 - 02/13/2020 Usage days 30/30 days (100%) >= 4 hours 30 days (100%) Average usage (days used) 6 hours 33 minutes   AirSense 10 AutoSet Serial number [id number] Mode AutoSet Min Pressure 5 cmH2O Max Pressure 13 cmH2O EPR Fulltime EPR level 1  Therapy Pressure - cmH2O Median: 7.7 95th percentile: 10.2 Maximum: 11.2 Leaks - L/min Median: 6.0 95th percentile: 22.8 Maximum: 32.4 Events per hour AI: 1.1 HI: 0.3 AHI: 1.4 Apnea Index Central: 0.7 Obstructive: 0.3 Unknown: 0.0  06/12/20: Alejandro Sherman is a 78 year old male with a history of obstructive sleep apnea on CPAP and memory disturbance.  He returns today to discuss his  memory.  He reports that when he initially started Aricept he noticed a change in his memory and attentiveness.  He also reports that his wife noticed this as well.  He states that since then these benefits have leveled off.  He has noticed some changes in his mood and behavior especially towards his wife.  He also notices some forgetfulness and trouble with his attention.  He returns today for an evaluation.  HISTORY 12/13/19:   Alejandro Sherman is a 78 year old male with a history of obstructive sleep apnea on CPAP and memory disturbance.  He returns today for follow-up.  His download  indicates that he use his machine 30 out of 30 days for compliance of 100%.  He uses machine greater than 4 hours each night.  On average he uses his machine 6 hours and 56 minutes.  His residual AHI is two on 5 to 13 cm of water with EPR of one.  His leak in the 95th percentile is 24.6 L/min.  He reports that the CPAP continues to work well for him.  He continues to notice some changes with his memory.  Reports that he can walk into a room and forget what he went in for.  He does state eventually it will come to him.  He does have hearing aids but reports that he does not wear them consistently now that we have to wear a mask.  He is able to complete all ADLs independently.  He manages his own finances.  Denies any trouble cooking.  He is currently retired and lives at home with his wife.  REVIEW OF SYSTEMS: Out of a complete 14 system review of symptoms, the patient complains only of the following symptoms, and all other reviewed systems are negative.  ESS 5 FSS21  ALLERGIES: Allergies  Allergen Reactions   [place] Grass Extract Other (See Comments)    Sneezing, itchy watery eyes   Metoprolol Anxiety    HOME MEDICATIONS: Outpatient Medications Prior to Visit  Medication Sig Dispense Refill   Cholecalciferol 50 MCG (2000 UT) TABS Take 2,000 Units by mouth daily.     ezetimibe (ZETIA) 10 MG tablet Take 10 mg by mouth daily.     fluticasone (FLONASE) 50 MCG/ACT nasal spray Place 2 sprays into both nostrils daily.     loratadine (CLARITIN) 10 MG tablet Take 10 mg by mouth.     meclizine (ANTIVERT) 12.5 MG tablet Take 12.5 mg by mouth 3 (three) times daily as needed.     mesalamine (LIALDA) 1.2 G EC tablet Take 1.2 g by mouth daily with breakfast.     metoprolol succinate (TOPROL XL) 50 MG 24 hr tablet Take 1 tablet (50 mg total) by mouth daily. Take with or immediately following a meal. 90 tablet 3   nitroGLYCERIN (NITROSTAT) 0.4 MG SL tablet Place 1 tablet under the tongue every 5 (five)  minutes as needed.     ondansetron (ZOFRAN) 4 MG tablet Take 4 mg by mouth every 6 (six) hours as needed.     Rosuvastatin Calcium 40 MG CPSP Take 1 tablet by mouth daily. 90 capsule 3   spironolactone (ALDACTONE) 25 MG tablet Take 1 tablet (25 mg total) by mouth daily. 90 tablet 3   No facility-administered medications prior to visit.    PAST MEDICAL HISTORY: Past Medical History:  Diagnosis Date   Bell's palsy    20 years ago, affecting the left facial nerve   CAD (coronary artery disease)    Edema  Essential hypertension, benign    History of Bell's palsy    Hyperlipidemia    Myocardial infarction (HCC) 10/28/1994   Obesity    OSA (obstructive sleep apnea)    on CPAP,  12-19-05   Ulcerative colitis (HCC)     PAST SURGICAL HISTORY: Past Surgical History:  Procedure Laterality Date   Ulcerative colitis  04/2010   Hospitalization    FAMILY HISTORY: Family History  Problem Relation Age of Onset   Kidney cancer Mother    Stroke Mother    Heart attack Father    Ovarian cancer Sister    Sleep apnea Neg Hx     SOCIAL HISTORY: Social History   Socioeconomic History   Marital status: Married    Spouse name: [name]   Number of children: 2   Years of education: 16   Highest education level: Not on file  Occupational History   Not on file  Tobacco Use   Smoking status: Former   Smokeless tobacco: Never  Substance and Sexual Activity   Alcohol use: Not Currently   Drug use: No   Sexual activity: Not on file  Other Topics Concern   Not on file  Social History Narrative   Patient is married. [name])   Patient has two adult children.   Patient is retired.   Patient has a college education.   Patient is right-handed.   Patient does not drink any caffeine.      Social Drivers of Corporate investment banker Strain: Low Risk  (04/02/2023)   [facility]   Overall Financial Resource Strain (CARDIA)    Difficulty of Paying Living Expenses: Not  hard at all  Food Insecurity: No Food Insecurity (04/02/2023)   Received from [facility]   Hunger Vital Sign    Worried About Running Out of Food in the Last Year: Never true    Ran Out of Food in the Last Year: Never true  Transportation Needs: No Transportation Needs (04/02/2023)   Received from [facility] - Transportation    Lack of Transportation (Medical): No    Lack of Transportation (Non-Medical): No  Physical Activity: Sufficiently Active (09/25/2022)   [facility] from [facility], [facility] visits prior to 12/28/2022., [facility] visits prior to 12/28/2022.   Exercise Vital Sign    Days of Exercise per Week: 3 days    Minutes of Exercise per Session: 60 min  Stress: No Stress Concern Present (09/25/2022)   Received from [facility] - Occupational Stress Questionnaire  Social Connections: Unknown (03/31/2023)   Received from [facility]   Social Network    Social Network: Not on file  Intimate Partner Violence: Unknown (03/31/2023)   Received from [facility]   HITS    Physically Hurt: Not on file    Insult or Talk Down To: Not on file    Threaten Physical Harm: Not on file    Scream or Curse: Not on file      PHYSICAL EXAM  Vitals:   01/15/24 1332  BP: 132/68  Pulse: (!) 54  Weight: 191 lb (86.6 kg)  Height: 5\' 9"  (1.753 m)    Body mass index is 28.21 kg/m.      06/12/2020    1:53 PM 12/13/2019   10:30 AM 12/16/2017    1:04 PM  MMSE - Mini Mental State Exam  Orientation to time  5 5 5   Orientation to Place 5 5 4   Registration 3 3 3   Attention/ Calculation 5 4 5   Recall 3 3 3   Language- name 2 objects 2 2 2   Language- repeat 1 1 1   Language- follow 3 step command 3 1 3   Language- read & follow direction 1 1 1   Write a sentence 1 1 1   Copy design 0 1 1  Copy design-comments  named 15 animals   Total score 29 27 29      Generalized:  Well developed, in no acute distress  Chest: Lungs clear to auscultation bilaterally  Neurological examination  Mentation: Alert oriented to time, place, history taking. Follows all commands speech and language fluent Cranial nerve II-XII: Facial symmetry noted  DIAGNOSTIC DATA (LABS, IMAGING, TESTING) - I reviewed patient records, labs, notes, testing and imaging myself where available.   Lab Results  Component Value Date   VITAMINB12 413 12/13/2019   Lab Results  Component Value Date   TSH 2.390 12/13/2019      ASSESSMENT AND PLAN 78 y.o. year old male  has a past medical history of Bell's palsy, CAD (coronary artery disease), Edema, Essential hypertension, benign, History of Bell's palsy, Hyperlipidemia, Myocardial infarction (HCC) (10/28/1994), Obesity, OSA (obstructive sleep apnea), and Ulcerative colitis (HCC). here with:    Obstructive sleep apnea on CPAP  CPAP download shows good compliance Good treatment of apnea Encouraged to use CPAP nightly and greater than 4 hours each night  Follow-up in 1 year or sooner if needed    Butch Penny, MSN, NP-C 01/15/2024, 1:32 PM [facility] [address], [address] [place], [place] [address] [phone]

## 2024-01-15 NOTE — Patient Instructions (Signed)
 Continue using CPAP nightly and greater than 4 hours each night If your symptoms worsen or you develop new symptoms please let us know.

## 2025-01-20 ENCOUNTER — Ambulatory Visit: Admitting: Adult Health
